# Patient Record
Sex: Female | Born: 1955 | Hispanic: No | Marital: Single | Smoking: Former smoker
Health system: Southern US, Community
[De-identification: ages and names within clinical notes are randomized; demographics above are authoritative.]

## PROBLEM LIST (undated history)

## (undated) DIAGNOSIS — R112 Nausea with vomiting, unspecified: Secondary | ICD-10-CM

## (undated) DIAGNOSIS — K219 Gastro-esophageal reflux disease without esophagitis: Secondary | ICD-10-CM

## (undated) DIAGNOSIS — M329 Systemic lupus erythematosus, unspecified: Secondary | ICD-10-CM

## (undated) DIAGNOSIS — Z9889 Other specified postprocedural states: Secondary | ICD-10-CM

## (undated) DIAGNOSIS — M3500 Sicca syndrome, unspecified: Secondary | ICD-10-CM

## (undated) DIAGNOSIS — M797 Fibromyalgia: Secondary | ICD-10-CM

## (undated) DIAGNOSIS — S62101A Fracture of unspecified carpal bone, right wrist, initial encounter for closed fracture: Secondary | ICD-10-CM

## (undated) HISTORY — PX: WRIST FRACTURE SURGERY: SHX121

## (undated) HISTORY — DX: Fibromyalgia: M79.7

## (undated) HISTORY — DX: Systemic lupus erythematosus, unspecified: M32.9

## (undated) HISTORY — PX: BACK SURGERY: SHX140

## (undated) HISTORY — PX: COLONOSCOPY: SHX174

---

## 2006-09-18 ENCOUNTER — Emergency Department: Payer: Self-pay | Admitting: Emergency Medicine

## 2009-04-28 ENCOUNTER — Emergency Department: Payer: Self-pay | Admitting: Unknown Physician Specialty

## 2010-11-08 ENCOUNTER — Emergency Department: Payer: Self-pay | Admitting: Unknown Physician Specialty

## 2011-09-01 DIAGNOSIS — M75101 Unspecified rotator cuff tear or rupture of right shoulder, not specified as traumatic: Secondary | ICD-10-CM

## 2011-09-01 HISTORY — DX: Unspecified rotator cuff tear or rupture of right shoulder, not specified as traumatic: M75.101

## 2012-12-15 ENCOUNTER — Ambulatory Visit: Payer: Self-pay | Admitting: Internal Medicine

## 2012-12-15 LAB — CBC WITH DIFFERENTIAL/PLATELET
Basophil #: 0.1 10*3/uL (ref 0.0–0.1)
Eosinophil #: 0.2 10*3/uL (ref 0.0–0.7)
Eosinophil %: 2.8 %
HGB: 11.9 g/dL — ABNORMAL LOW (ref 12.0–16.0)
Lymphocyte %: 27.8 %
MCHC: 31.9 g/dL — ABNORMAL LOW (ref 32.0–36.0)
Monocyte #: 0.6 x10 3/mm (ref 0.2–0.9)
Neutrophil #: 4.8 10*3/uL (ref 1.4–6.5)
Neutrophil %: 60.6 %
RDW: 13.2 % (ref 11.5–14.5)

## 2012-12-15 LAB — COMPREHENSIVE METABOLIC PANEL
Albumin: 3.9 g/dL (ref 3.4–5.0)
BUN: 10 mg/dL (ref 7–18)
Bilirubin,Total: 0.4 mg/dL (ref 0.2–1.0)
Calcium, Total: 9.1 mg/dL (ref 8.5–10.1)
EGFR (African American): >60
EGFR (Non-African Amer.): >60
Glucose: 95 mg/dL (ref 65–99)
Osmolality: 280 (ref 275–301)
SGOT(AST): 21 U/L (ref 15–37)
SGPT (ALT): 26 U/L (ref 12–78)
Total Protein: 7.7 g/dL (ref 6.4–8.2)

## 2012-12-19 ENCOUNTER — Encounter: Payer: Self-pay | Admitting: *Deleted

## 2012-12-20 ENCOUNTER — Ambulatory Visit (INDEPENDENT_AMBULATORY_CARE_PROVIDER_SITE_OTHER): Payer: BC Managed Care – PPO | Admitting: Cardiovascular Disease

## 2012-12-20 ENCOUNTER — Encounter: Payer: Self-pay | Admitting: Cardiovascular Disease

## 2012-12-20 VITALS — BP 102/68 | HR 89 | Ht 62.0 in | Wt 181.0 lb

## 2012-12-20 VITALS — BP 100/70 | HR 89 | Ht 62.0 in | Wt 181.0 lb

## 2012-12-20 DIAGNOSIS — R079 Chest pain, unspecified: Secondary | ICD-10-CM | POA: Insufficient documentation

## 2012-12-20 DIAGNOSIS — R0600 Dyspnea, unspecified: Secondary | ICD-10-CM

## 2012-12-20 DIAGNOSIS — R0609 Other forms of dyspnea: Secondary | ICD-10-CM

## 2012-12-20 NOTE — Assessment & Plan Note (Signed)
The patient has exertional chest pain which has been going on for a week after she was exposed to some form of a strange smell.  Physical examination is unremarkable and baseline ECG does not show any acute changes. She does have risk factors for coronary artery disease and thus I will proceed with a treadmill stress test for evaluation.

## 2012-12-20 NOTE — Patient Instructions (Addendum)
Your physician has requested that you have an exercise tolerance test. For further information please visit [email or web address]. Please also follow instruction sheet, as given.  Your physician has requested that you have an echocardiogram. Echocardiography is a painless test that uses sound waves to create images of your heart. It provides your doctor with information about the size and shape of your heart and how well your heart's chambers and valves are working. This procedure takes approximately one hour. There are no restrictions for this procedure.   

## 2012-12-20 NOTE — Assessment & Plan Note (Signed)
It is possible that she had some kind of pulmonary allergic reaction or bronchospasm. She is currently not wheezing. I will obtain an echocardiogram to evaluate LV systolic function and pulmonary pressure. If her cardiac evaluation is unremarkable and she continues to have symptoms, she will need pulmonary evaluation.

## 2012-12-20 NOTE — Progress Notes (Signed)
HPI  This is a 57 year old female who is here for consultation regarding chest pain and dyspnea. Sharon Weeks was referred by Dr. Dayton Scrape at [facility] urgent care. Sharon Weeks has no previous cardiac history. Sharon Weeks has known history of lupus, fibromyalgia and Sjgren syndrome. Sharon Weeks reports possibly having cardiac catheterization in the remote past and was told there was no significant blockages. Sharon Weeks has family history of coronary artery disease. Sharon Weeks is currently not a smoker. Sharon Weeks was in the airport last week and got in the airport shuttle. In size, there was a strange diesel-like smell which made her very sick with acute shortness of breath, chest tightness, coughing and vomiting. Since then, Sharon Weeks has not felt well. Sharon Weeks continues to report exertional chest tightness and shortness of breath. There has been no wheezing. Sharon Weeks was given breathing treatments at the urgent care with no significant improvement. EKG showed no significant abnormalities. Her labs were overall unremarkable except for mild anemia with a hemoglobin of 11.9. Sharon Weeks was not hypoxic.  Allergies  Allergen Reactions  . Codeine      Current Outpatient Prescriptions on File Prior to Visit  Medication Sig Dispense Refill  . aspirin 81 MG tablet Take 81 mg by mouth daily.      . hydroxychloroquine (PLAQUENIL) 200 MG tablet Take by mouth daily.      . nitroGLYCERIN (NITROSTAT) 0.4 MG SL tablet Place 0.4 mg under the tongue every 5 (five) minutes as needed for chest pain.       No current facility-administered medications on file prior to visit.     Past Medical History  Diagnosis Date  . Fibromyalgia   . Lupus      Past Surgical History  Procedure Laterality Date  . Back surgery       Family History  Problem Relation Age of Onset  . Heart attack Father   . Heart failure Sister      History   Social History  . Marital Status: Single    Spouse Name: N/A    Number of Children: N/A  . Years of Education: N/A   Occupational History  .  Not on file.   Social History Main Topics  . Smoking status: Former Smoker -- 0.50 packs/day for 12 years    Quit date: 12/20/1985  . Smokeless tobacco: Not on file  . Alcohol Use: Not on file  . Drug Use: Not on file  . Sexually Active: Not on file   Other Topics Concern  . Not on file   Social History Narrative  . No narrative on file     ROS Constitutional: Negative for fever, chills, diaphoresis, activity change, appetite change and fatigue.  HENT: Negative for hearing loss, nosebleeds, congestion, sore throat, facial swelling, drooling, trouble swallowing, neck pain, voice change, sinus pressure and tinnitus.  Eyes: Negative for photophobia, pain, discharge and visual disturbance.  Respiratory: Negative for   wheezing.  Cardiovascular: Negative for palpitations and leg swelling.  Gastrointestinal: Negative for nausea, vomiting, abdominal pain, diarrhea, constipation, blood in stool and abdominal distention.  Genitourinary: Negative for dysuria, urgency, frequency, hematuria and decreased urine volume.  Musculoskeletal: Negative for myalgias, back pain, joint swelling, arthralgias and gait problem.  Skin: Negative for color change, pallor, rash and wound.  Neurological: Negative for dizziness, tremors, seizures, syncope, speech difficulty, weakness, light-headedness, numbness and headaches.  Psychiatric/Behavioral: Negative for suicidal ideas, hallucinations, behavioral problems and agitation. The patient is not nervous/anxious.     PHYSICAL EXAM   BP 100/70  Pulse 89  Ht 5\' 2"  (1.575 m)  Wt 181 lb (82.101 kg)  BMI 33.1 kg/m2 Constitutional: Sharon Weeks is oriented to person, place, and time. Sharon Weeks appears well-developed and well-nourished. No distress.  HENT: No nasal discharge.  Head: Normocephalic and atraumatic.  Eyes: Pupils are equal and round. Right eye exhibits no discharge. Left eye exhibits no discharge.  Neck: Normal range of motion. Neck supple. No JVD present. No  thyromegaly present.  Cardiovascular: Normal rate, regular rhythm, normal heart sounds. Exam reveals no gallop and no friction rub. No murmur heard.  Pulmonary/Chest: Effort normal and breath sounds normal. No stridor. No respiratory distress. Sharon Weeks has no wheezes. Sharon Weeks has no rales. Sharon Weeks exhibits no tenderness.  Abdominal: Soft. Bowel sounds are normal. Sharon Weeks exhibits no distension. There is no tenderness. There is no rebound and no guarding.  Musculoskeletal: Normal range of motion. Sharon Weeks exhibits no edema and no tenderness.  Neurological: Sharon Weeks is alert and oriented to person, place, and time. Coordination normal.  Skin: Skin is warm and dry. No rash noted. Sharon Weeks is not diaphoretic. No erythema. No pallor.  Psychiatric: Sharon Weeks has a normal mood and affect. Her behavior is normal. Judgment and thought content normal.     EKG: Normal sinus rhythm with poor R-wave progression in the anterior leads. No significant ST or T wave changes.   ASSESSMENT AND PLAN

## 2012-12-20 NOTE — Procedures (Signed)
Exercise Treadmill Test  Pre-Exercise Testing Evaluation Rhythm: normal sinus  Rate: 92            ST Segments:  no significant ST changes at rest     Test  Exercise Tolerance Test Ordering MD: Lorine Bears, MD Interpreting MD: Odella Aquas, PA-C     Indication for ETT: chest pain - rule out ischemia  Contraindication to ETT: No   Stress Modality: exercise - treadmill  Cardiac Imaging Performed: non   Protocol: standard Bruce - maximal  Max BP:  164/62  Max MPHR (bpm):  166 85% MPR (bpm):  141  MPHR obtained (bpm):  169 % MPHR obtained:  102  Reached 85% MPHR (min:sec):  2:00 Total Exercise Time (min-sec):  9:00  Workload in METS:  10.1   Reason ETT Terminated:  FATIGUE    ST Segment Analysis At Rest: normal ST segments - no evidence of significant ST depression With Exercise: no evidence of significant ST depression  Other Information Arrhythmia:  No Angina during ETT:  absent (0)   ETT Interpretation:  normal - no evidence of ischemia by ST analysis  Comments:  Significant motion artifact. Poor R wave progression at rest, stress and recovery. Nonspecific diffuse upsloping ST depressions on exertion, returned to baseline on recovery. No chest pain with exercise.   Recommendations: Normal exercise tolerance test. No evidence of ischemia. Plan 2D echo for further evaluation and recommendations.

## 2012-12-20 NOTE — Patient Instructions (Addendum)
Your exercise stress test is normal.   Will plan to perform the ultrasound on your heart. Please arrive for this as scheduled.   If your symptoms continue in the next few weeks, please call the office for a referral to the lung specialists for further evaluation.

## 2013-01-03 ENCOUNTER — Other Ambulatory Visit (INDEPENDENT_AMBULATORY_CARE_PROVIDER_SITE_OTHER): Payer: BC Managed Care – PPO

## 2013-01-03 ENCOUNTER — Other Ambulatory Visit: Payer: Self-pay

## 2013-01-03 DIAGNOSIS — R0989 Other specified symptoms and signs involving the circulatory and respiratory systems: Secondary | ICD-10-CM

## 2013-01-03 DIAGNOSIS — R0609 Other forms of dyspnea: Secondary | ICD-10-CM

## 2013-01-03 DIAGNOSIS — R0600 Dyspnea, unspecified: Secondary | ICD-10-CM

## 2013-01-03 DIAGNOSIS — R079 Chest pain, unspecified: Secondary | ICD-10-CM

## 2013-11-04 ENCOUNTER — Ambulatory Visit: Payer: Self-pay | Admitting: Internal Medicine

## 2013-11-04 LAB — COMPREHENSIVE METABOLIC PANEL
ALBUMIN: 3.6 g/dL (ref 3.4–5.0)
ALT: 26 U/L (ref 12–78)
AST: 15 U/L (ref 15–37)
Alkaline Phosphatase: 78 U/L
Anion Gap: 10 (ref 7–16)
BILIRUBIN TOTAL: 0.2 mg/dL (ref 0.2–1.0)
BUN: 9 mg/dL (ref 7–18)
CALCIUM: 8.7 mg/dL (ref 8.5–10.1)
CO2: 26 mmol/L (ref 21–32)
CREATININE: 1.01 mg/dL (ref 0.60–1.30)
Chloride: 105 mmol/L (ref 98–107)
EGFR (Non-African Amer.): >60
GLUCOSE: 85 mg/dL (ref 65–99)
Osmolality: 279 (ref 275–301)
POTASSIUM: 3.9 mmol/L (ref 3.5–5.1)
SODIUM: 141 mmol/L (ref 136–145)
Total Protein: 7.2 g/dL (ref 6.4–8.2)

## 2013-11-04 LAB — URINALYSIS, COMPLETE
Bacteria: NEGATIVE
Bilirubin,UR: NEGATIVE
Blood: NEGATIVE
Glucose,UR: NEGATIVE mg/dL (ref 0–75)
Ketone: NEGATIVE
Leukocyte Esterase: NEGATIVE
Nitrite: NEGATIVE
Ph: 7.5 (ref 4.5–8.0)
Protein: NEGATIVE
Specific Gravity: 1.005 (ref 1.003–1.030)
WBC UR: NONE SEEN /HPF (ref 0–5)

## 2013-11-04 LAB — CBC WITH DIFFERENTIAL/PLATELET
Basophil #: 0 10*3/uL (ref 0.0–0.1)
Basophil %: 0.8 %
EOS PCT: 1.5 %
Eosinophil #: 0.1 10*3/uL (ref 0.0–0.7)
HCT: 38.6 % (ref 35.0–47.0)
HGB: 12.4 g/dL (ref 12.0–16.0)
LYMPHS PCT: 35.1 %
Lymphocyte #: 2 10*3/uL (ref 1.0–3.6)
MCH: 28.1 pg (ref 26.0–34.0)
MCHC: 32.2 g/dL (ref 32.0–36.0)
MCV: 87 fL (ref 80–100)
MONOS PCT: 7.8 %
Monocyte #: 0.4 x10 3/mm (ref 0.2–0.9)
NEUTROS ABS: 3.1 10*3/uL (ref 1.4–6.5)
Neutrophil %: 54.8 %
Platelet: 248 10*3/uL (ref 150–440)
RBC: 4.43 10*6/uL (ref 3.80–5.20)
RDW: 13 % (ref 11.5–14.5)
WBC: 5.6 10*3/uL (ref 3.6–11.0)

## 2013-11-04 LAB — CK: CK, TOTAL: 67 U/L

## 2013-11-04 LAB — CK-MB: CK-MB: 1.2 ng/mL (ref 0.5–3.6)

## 2014-06-27 DIAGNOSIS — M797 Fibromyalgia: Secondary | ICD-10-CM | POA: Insufficient documentation

## 2014-06-27 DIAGNOSIS — G4700 Insomnia, unspecified: Secondary | ICD-10-CM | POA: Insufficient documentation

## 2014-10-23 DIAGNOSIS — M329 Systemic lupus erythematosus, unspecified: Secondary | ICD-10-CM | POA: Insufficient documentation

## 2014-11-22 DIAGNOSIS — K219 Gastro-esophageal reflux disease without esophagitis: Secondary | ICD-10-CM | POA: Insufficient documentation

## 2015-01-31 DIAGNOSIS — M1712 Unilateral primary osteoarthritis, left knee: Secondary | ICD-10-CM | POA: Insufficient documentation

## 2015-06-22 ENCOUNTER — Encounter: Payer: Self-pay | Admitting: Gynecology

## 2015-06-22 ENCOUNTER — Ambulatory Visit
Admission: EM | Admit: 2015-06-22 | Discharge: 2015-06-22 | Disposition: A | Discharge status: Home / Self Care | Payer: Worker's Compensation | Attending: Emergency Medicine | Admitting: Emergency Medicine

## 2015-06-22 ENCOUNTER — Ambulatory Visit (INDEPENDENT_AMBULATORY_CARE_PROVIDER_SITE_OTHER): Payer: Worker's Compensation

## 2015-06-22 DIAGNOSIS — R52 Pain, unspecified: Secondary | ICD-10-CM | POA: Diagnosis not present

## 2015-06-22 DIAGNOSIS — S93402A Sprain of unspecified ligament of left ankle, initial encounter: Secondary | ICD-10-CM

## 2015-06-22 DIAGNOSIS — M25531 Pain in right wrist: Secondary | ICD-10-CM | POA: Diagnosis not present

## 2015-06-22 DIAGNOSIS — S8002XA Contusion of left knee, initial encounter: Secondary | ICD-10-CM

## 2015-06-22 MED ORDER — TRAMADOL HCL 50 MG PO TABS: 50.0000 mg | ORAL_TABLET | Freq: Three times a day (TID) | ORAL | Status: DC | PRN

## 2015-06-22 NOTE — Discharge Instructions (Signed)
Take medication as prescribed. Apply ice. Elevate and rest right rest. Wear right wrist splint as long as pain continues.  Follow-up with her primary care physician as needed. Discussed follow-up with orthopedic next week as needed for continued right wrist pain.  Return to the urgent care as needed for new or worsening concerns.  Ankle Sprain An ankle sprain is an injury to the strong, fibrous tissues (ligaments) that hold your ankle bones together.  HOME CARE   Put ice on your ankle for 1-2 days or as told by your doctor.  Put ice in a plastic bag.  Place a towel between your skin and the bag.  Leave the ice on for 15-20 minutes at a time, every 2 hours while you are awake.  Only take medicine as told by your doctor.  Raise (elevate) your injured ankle above the level of your heart as much as possible for 2-3 days.  Use crutches if your doctor tells you to. Slowly put your own weight on the affected ankle. Use the crutches until you can walk without pain.  If you have a plaster splint:  Do not rest it on anything harder than a pillow for 24 hours.  Do not put weight on it.  Do not get it wet.  Take it off to shower or bathe.  If given, use an elastic wrap or support stocking for support. Take the wrap off if your toes lose feeling (numb), tingle, or turn cold or blue.  If you have an air splint:  Add or let out air to make it comfortable.  Take it off at night and to shower and bathe.  Wiggle your toes and move your ankle up and down often while you are wearing it. GET HELP IF:  You have rapidly increasing bruising or puffiness (swelling).  Your toes feel very cold.  You lose feeling in your foot.  Your medicine does not help your pain. GET HELP RIGHT AWAY IF:   Your toes lose feeling (numb) or turn blue.  You have severe pain that is increasing. MAKE SURE YOU:   Understand these instructions.  Will watch your condition.  Will get help right away if  you are not doing well or get worse.   This information is not intended to replace advice given to you by your health care provider. Make sure you discuss any questions you have with your health care provider.   Document Released: 02/03/2008 Document Revised: 09/07/2014 Document Reviewed: 02/29/2012 Elsevier Interactive Patient Education 2016 [place] A contusion is a deep bruise. Contusions happen when an injury causes bleeding under the skin. Symptoms of bruising include pain, swelling, and discolored skin. The skin may turn blue, purple, or yellow. HOME CARE   Rest the injured area.  If told, put ice on the injured area.  Put ice in a plastic bag.  Place a towel between your skin and the bag.  Leave the ice on for 20 minutes, 2-3 times per day.  If told, put light pressure (compression) on the injured area using an elastic bandage. Make sure the bandage is not too tight. Remove it and put it back on as told by your doctor.  If possible, raise (elevate) the injured area above the level of your heart while you are sitting or lying down.  Take over-the-counter and prescription medicines only as told by your doctor. GET HELP IF:  Your symptoms do not get better after several days of treatment.  Your  symptoms get worse.  You have trouble moving the injured area. GET HELP RIGHT AWAY IF:   You have very bad pain.  You have a loss of feeling (numbness) in a hand or foot.  Your hand or foot turns pale or cold.   This information is not intended to replace advice given to you by your health care provider. Make sure you discuss any questions you have with your health care provider.   Document Released: 02/03/2008 Document Revised: 05/08/2015 Document Reviewed: 01/02/2015 Elsevier Interactive Patient Education 2016 [employer]  Wrist Pain There are many things that can cause wrist pain. Some common causes include:  An injury to the wrist area, such as a  sprain, strain, or fracture.  Overuse of the joint.  A condition that causes increased pressure on a nerve in the wrist (carpal tunnel syndrome).  Wear and tear of the joints that occurs with aging (osteoarthritis).  A variety of other types of arthritis. Sometimes, the cause of wrist pain is not known. The pain often goes away when you follow your health care provider's instructions for relieving pain at home. If your wrist pain continues, tests may need to be done to diagnose your condition. HOME CARE INSTRUCTIONS Pay attention to any changes in your symptoms. Take these actions to help with your pain:  Rest the wrist area for at least 48 hours or as told by your health care provider.  If directed, apply ice to the injured area:  Put ice in a plastic bag.  Place a towel between your skin and the bag.  Leave the ice on for 20 minutes, 2-3 times per day.  Keep your arm raised (elevated) above the level of your heart while you are sitting or lying down.  If a splint or elastic bandage has been applied, use it as told by your health care provider.  Remove the splint or bandage only as told by your health care provider.  Loosen the splint or bandage if your fingers become numb or have a tingling feeling, or if they turn cold or blue.  Take over-the-counter and prescription medicines only as told by your health care provider.  Keep all follow-up visits as told by your health care provider. This is important. SEEK MEDICAL CARE IF:  Your pain is not helped by treatment.  Your pain gets worse. SEEK IMMEDIATE MEDICAL CARE IF:  Your fingers become swollen.  Your fingers turn white, very red, or cold and blue.  Your fingers are numb or have a tingling feeling.  You have difficulty moving your fingers.   This information is not intended to replace advice given to you by your health care provider. Make sure you discuss any questions you have with your health care provider.     Document Released: 05/27/2005 Document Revised: 05/08/2015 Document Reviewed: 01/02/2015 Elsevier Interactive Patient Education [employer].

## 2015-06-22 NOTE — ED Notes (Signed)
Per patient step on mat at doorway at work when fell on right arm. Per patient right arm pain and left ankle twisted.

## 2015-06-22 NOTE — ED Provider Notes (Signed)
[facility] Emergency Department Provider Note  ____________________________________________  Time seen: Approximately 1230 PM  I have reviewed the triage vital signs and the nursing notes.   HISTORY  Chief Complaint Work Related Injury   HPI Sharon Weeks is a 59 y.o. female presents with a complaint of right wrist pain. Patient reports that just prior to arrival she was at work. Patient states that she was walking out the door way, she stepped on the mat but reports the door mat moved causing her to trip over the mat and fall. Patient states that she fell due to tripping over the mat directly. Patient states that she felt fine prior to the fall. States that when she landed she tried to catch herself with her right wrist. However reports that she was an able to get her palm out and hit the dorsal surface of her right wrist on the ground. Denies head injury or loss of consciousness. Denies neck or back injury or pain. Reports has been ambulatory since. Reports that the injury was 2 hours prior to arrival.  Patient reports current right wrist pain is 6-7 out of 10. States pain is an aching throbbing pain. Denies pain radiation. Denies numbness or tingling sensation. Denies difficulty moving finger or hands. Again states pain is primarily in right wrist.  Patient does also reports that when she landed she landed on both of her knees. Patient states that she also feels like she twisted her left ankle. States some left knee pain and left ankle pain. Denies right knee pain. Reports has continued to ambulate well but with mild pain to left knee and left ankle. Again denies neck or back pain. Denies head injury, loss of consciousness, neck or back pain or injury. Denies chest pain, shortness of breath, dizziness, nausea, vomiting, diarrhea, abdominal pain, vision changes or confusion. Daughter also at bedside.   Past Medical History  Diagnosis Date  . Fibromyalgia   .  Lupus [facility])     Patient Active Problem List   Diagnosis Date Noted  . Chest pain 12/20/2012  . Dyspnea 12/20/2012    Past Surgical History  Procedure Laterality Date  . Back surgery      Current Outpatient Rx  Name  Route  Sig  Dispense  Refill  . aspirin 81 MG tablet   Oral   Take 81 mg by mouth daily.         .             . gabapentin (NEURONTIN) 400 MG capsule               . hydroxychloroquine (PLAQUENIL) 200 MG tablet   Oral   Take by mouth daily.         . ranitidine (ZANTAC) 150 MG capsule   Oral   Take 150 mg by mouth 2 (two) times daily.         . nitroGLYCERIN (NITROSTAT) 0.4 MG SL tablet   Sublingual   Place 0.4 mg under the tongue every 5 (five) minutes as needed for chest pain.         .             Allergies Codeine  Family History  Problem Relation Age of Onset  . Heart attack Father   . Heart failure Sister     Social History Social History  Substance Use Topics  . Smoking status: Former Smoker -- 0.50 packs/day for 12 years    Quit  date: 12/20/1985  . Smokeless tobacco: None  . Alcohol Use: No    Review of Systems Constitutional: No fever/chills Eyes: No visual changes. ENT: No sore throat. Cardiovascular: Denies chest pain. Respiratory: Denies shortness of breath. Gastrointestinal: No abdominal pain.  No nausea, no vomiting.  No diarrhea.  No constipation. Genitourinary: Negative for dysuria. Musculoskeletal: Negative for back pain. Right wrist pain, left knee and ankle pain.  Skin: Negative for rash. Neurological: Negative for headaches, focal weakness or numbness.  10-point ROS otherwise negative.  ____________________________________________   PHYSICAL EXAM:  VITAL SIGNS: ED Triage Vitals  Enc Vitals Group     BP 06/22/15 1138 125/71 mmHg     Pulse Rate 06/22/15 1138 89     Resp 06/22/15 1138 16     Temp 06/22/15 1138 97.1 F (36.2 C)     Temp Source 06/22/15 1138 Tympanic     SpO2 06/22/15 1138 98  %     Weight 06/22/15 1138 169 lb (76.658 kg)     Height 06/22/15 1138 5\' 1"  (1.549 m)     Head Cir --      Peak Flow --      Pain Score 06/22/15 1136 8     Pain Loc --      Pain Edu? --      Excl. in [place]? --     Constitutional: Alert and oriented. Well appearing and in no acute distress. Eyes: Conjunctivae are normal. PERRL. EOMI. Head: Atraumatic.no swelling or ecchymosis. Nontender.   Nose: No congestion/rhinnorhea.  Mouth/Throat: Mucous membranes are moist.   Neck: No stridor.  No cervical spine tenderness to palpation. Hematological/Lymphatic/Immunilogical: No cervical lymphadenopathy. Cardiovascular: Normal rate, regular rhythm. Grossly normal heart sounds.  Good peripheral circulation. Respiratory: Normal respiratory effort.  No retractions. Lungs CTAB. Gastrointestinal: Soft and nontender. No distention. Normal Bowel sounds. No CVA tenderness. Musculoskeletal: No lower or upper extremity tenderness nor edema.  No joint effusions. Bilateral pedal pulses equal and easily palpated. No cervical, thoracic or lumbar tenderness to palpation.  Except: Right distal wrist mild to mod TTP along radial aspect, mild distal ulnar TTP, no swelling or ecchymosis, Full ROM, bilateral hand grips equal, distal radial pulses equal bilaterally, sensation and motor intact to right hand. No anatomical snuff box tenderness.  Left anterior knee mild TTP, minimal ecchymosis, no swelling, full ROM, stable, no pain with anterior or posterior drawer test, no pain with medial or lateral stress test.  Left lateral ankle mild TTP, no swelling or ecchymosis, full ROM, sensation and motor intact to left foot. Steady gait. Full ROM present to bilateral upper and lower extremities.  Neurologic:  Normal speech and language. No gross focal neurologic deficits are appreciated. No gait instability. Skin:  Skin is warm, dry and intact. No rash noted. Psychiatric: Mood and affect are normal. Speech and behavior are  normal.  ____________________________________________   LABS (all labs ordered are listed, but only abnormal results are displayed)  Labs Reviewed - No data to display  RADIOLOGY  EXAM: RIGHT WRIST - COMPLETE 3+ VIEW  COMPARISON: None  FINDINGS: Joint spaces preserved.  No acute fracture, dislocation or bone destruction.  Minimal dorsal soft tissue swelling.  IMPRESSION: No acute osseous abnormalities.   Electronically Signed By: Lavonia Dana M.D. On: 06/22/2015 13:13          DG Knee Complete 4 Views Left (Final result) Result time: 06/22/15 13:12:28   Final result by Rad Results In Interface (06/22/15 13:12:28)   Narrative:   CLINICAL  DATA: Tripped and fell on a rug at work injuring lateral side of LEFT knee, history lupus, fibromyalgia  EXAM: LEFT KNEE - COMPLETE 4+ VIEW  COMPARISON: None  FINDINGS: Medial compartment joint space narrowing and spur formation.  Mild osseous demineralization.  No acute fracture, dislocation or bone destruction.  No knee joint effusion.  Small spur at cranial margin of patellar articular surface.  IMPRESSION: Osseous demineralization with mild degenerative changes LEFT knee.  No acute bony abnormalities.   Electronically Signed By: Lavonia Dana M.D. On: 06/22/2015 13:12          DG Ankle Complete Left (Final result) Result time: 06/22/15 13:12:50   Final result by Rad Results In Interface (06/22/15 13:12:50)   Narrative:   CLINICAL DATA: Tripped and fell on rug at work injuring left lateral ankle.  EXAM: LEFT ANKLE COMPLETE - 3+ VIEW  COMPARISON: None.  FINDINGS: The ankle mortise is within normal. There is no acute fracture or dislocation. A small inferior calcaneal spur is present.  IMPRESSION: Negative.   Electronically Signed By: Marin Olp M.D. On: 06/22/2015 13:12      I, Marylene Land, personally viewed and evaluated these images (plain radiographs) as  part of my medical decision making.   ____________________________________________   PROCEDURES  Procedure(s) performed:  Right velcro cock up splint applied by RN. Neurovascular intact post application.   Patient denies need for crutches, knee or ankle splint.  ____________________________________________   INITIAL IMPRESSION / ASSESSMENT AND PLAN / ED COURSE  Pertinent labs & imaging results that were available during my care of the patient were reviewed by me and considered in my medical decision making (see chart for details).  Presents the urgent care post reports mechanical fall at work. Patient reports that she felt fine prior to the fall but reports that she tripped over a door mat which caused her to fall. States that she tried to catch herself with her right wrist however she was unable to. Also reports that she has pain to left knee and her left ankle. Denies head injury or loss of consciousness. Patient is very well-appearing in no acute distress. Changes position from lying to standing quickly without discomfort or distress. Ambulatory with steady gait. No focal neurological deficits. Will evaluate right wrist, left knee and left ankle x-ray. Patient does report that this is a workers [employer] loss [employer]. Suspect contusion injuries.   Xrays negative for acute changes. Right wrist splint for support. Ice and rest. PRN tramadol as needed for pain, patient reports has taken this in the past and tolerated well. PRN tylenol or ibuprofen over the counter. Discussed to wear wrist splint for as long as pain continues or one week. Discussed with patient to follow up with orthopedic in one week of employers choice as needed for continued pain. Discussed follow up with Primary care physician this week. Discussed follow up and return parameters including no resolution or any worsening concerns. Patient verbalized understanding and agreed to plan.    ____________________________________________   FINAL CLINICAL IMPRESSION(S) / ED DIAGNOSES  Final diagnoses:    Right wrist pain  Knee contusion, left, initial encounter  Left ankle sprain, initial encounter       Marylene Land, NP 06/23/15 [phone]

## 2015-07-08 DIAGNOSIS — S63519A Sprain of carpal joint of unspecified wrist, initial encounter: Secondary | ICD-10-CM | POA: Insufficient documentation

## 2017-07-05 ENCOUNTER — Ambulatory Visit
Admission: RE | Admit: 2017-07-05 | Discharge: 2017-07-05 | Disposition: A | Discharge status: Home / Self Care | Payer: Disability Insurance | Source: Ambulatory Visit | Attending: Otolaryngology | Admitting: Otolaryngology

## 2017-07-05 ENCOUNTER — Other Ambulatory Visit: Payer: Self-pay | Admitting: Otolaryngology

## 2017-07-05 ENCOUNTER — Ambulatory Visit
Admission: RE | Admit: 2017-07-05 | Discharge: 2017-07-05 | Disposition: A | Discharge status: Home / Self Care | Payer: Disability Insurance | Source: Ambulatory Visit | Attending: Diagnostic Radiology | Admitting: Diagnostic Radiology

## 2017-07-05 DIAGNOSIS — Z981 Arthrodesis status: Secondary | ICD-10-CM | POA: Insufficient documentation

## 2017-07-05 DIAGNOSIS — M5126 Other intervertebral disc displacement, lumbar region: Secondary | ICD-10-CM

## 2017-07-05 DIAGNOSIS — M4316 Spondylolisthesis, lumbar region: Secondary | ICD-10-CM | POA: Diagnosis not present

## 2017-07-05 DIAGNOSIS — M5136 Other intervertebral disc degeneration, lumbar region: Secondary | ICD-10-CM

## 2017-07-05 DIAGNOSIS — M1990 Unspecified osteoarthritis, unspecified site: Secondary | ICD-10-CM

## 2017-07-05 DIAGNOSIS — M797 Fibromyalgia: Secondary | ICD-10-CM | POA: Diagnosis present

## 2017-07-05 DIAGNOSIS — R9389 Abnormal findings on diagnostic imaging of other specified body structures: Secondary | ICD-10-CM | POA: Insufficient documentation

## 2017-07-05 DIAGNOSIS — M329 Systemic lupus erythematosus, unspecified: Secondary | ICD-10-CM | POA: Diagnosis present

## 2018-01-06 ENCOUNTER — Ambulatory Visit
Admission: EM | Admit: 2018-01-06 | Discharge: 2018-01-06 | Disposition: A | Discharge status: Home / Self Care | Payer: PRIVATE HEALTH INSURANCE | Attending: Family Medicine | Admitting: Family Medicine

## 2018-01-06 DIAGNOSIS — R05 Cough: Secondary | ICD-10-CM

## 2018-01-06 DIAGNOSIS — R0981 Nasal congestion: Secondary | ICD-10-CM

## 2018-01-06 DIAGNOSIS — R058 Other specified cough: Secondary | ICD-10-CM

## 2018-01-06 MED ORDER — BENZONATATE 100 MG PO CAPS: 100.0000 mg | ORAL_CAPSULE | Freq: Three times a day (TID) | ORAL | 0 refills | Status: DC | PRN

## 2018-01-06 MED ORDER — HYDROCOD POLST-CPM POLST ER 10-8 MG/5ML PO SUER: 5.0000 mL | Freq: Every evening | ORAL | 0 refills | Status: DC | PRN

## 2018-01-06 MED ORDER — DOXYCYCLINE HYCLATE 100 MG PO CAPS: 100.0000 mg | ORAL_CAPSULE | Freq: Two times a day (BID) | ORAL | 0 refills | Status: DC

## 2018-01-06 NOTE — ED Triage Notes (Signed)
Pt here for a cold for over a month and states it's getting worse. Runny nose, nasal congestion, productive cough and feeling tired/weak. Taking coricidin, cold and allergy medication and nyquil without relief.

## 2018-01-06 NOTE — Discharge Instructions (Addendum)
Take medication as prescribed. Rest. Drink plenty of fluids.  ° °Follow up with your primary care physician this week as needed. Return to Urgent care for new or worsening concerns.  ° °

## 2018-01-06 NOTE — ED Provider Notes (Signed)
[facility] ____________________________________________  Time seen: Approximately 6:37 PM  I have reviewed the triage vital signs and the nursing notes.   HISTORY  Chief Complaint URI   HPI Sharon Weeks is a 62 y.o. female presented for evaluation of cough and congestion symptoms have been present for the last 1 month.  States initially started off more like a cold or allergies, but has continued to progress.  States was more in nasal and facial, but now more in the chest.  States cough is intermittently productive.  States cough has been disrupting sleep and unrelieved with over-the-counter cough and congestion medications including NyQuil and Coricidin.  Denies known fevers, but has reports some intermittent chills and flushed sensations.  Denies known direct sick contacts.  Continues to overall eat and drink well, but decreased appetite.  No extremity swelling or extremity pain.  Denies history of COPD, asthma.  But does report history of allergic rhinitis.  Denies other aggravating or alleviating factors.  Reports otherwise feels well.Denies recent sickness. Denies recent antibiotic use.   Past Medical History:  Diagnosis Date  . Fibromyalgia   . Lupus [facility])     Patient Active Problem List   Diagnosis Date Noted  . Chest pain 12/20/2012  . Dyspnea 12/20/2012    Past Surgical History:  Procedure Laterality Date  . BACK SURGERY       No current facility-administered medications for this encounter.   Current Outpatient Medications:  .  gabapentin (NEURONTIN) 400 MG capsule, , Disp: , Rfl:  .  hydroxychloroquine (PLAQUENIL) 200 MG tablet, Take by mouth daily., Disp: , Rfl:  .  aspirin 81 MG tablet, Take 81 mg by mouth daily., Disp: , Rfl:  .  benzonatate (TESSALON PERLES) 100 MG capsule, Take 1 capsule (100 mg total) by mouth 3 (three) times daily as needed for cough., Disp: 15 capsule, Rfl: 0 .  chlorpheniramine-HYDROcodone (TUSSIONEX PENNKINETIC ER)  10-8 MG/5ML SUER, Take 5 mLs by mouth at bedtime as needed for cough. do not drive or operate machinery while taking as can cause drowsiness., Disp: 50 mL, Rfl: 0 .  cyclobenzaprine (FLEXERIL) 10 MG tablet, , Disp: , Rfl:  .  doxycycline (VIBRAMYCIN) 100 MG capsule, Take 1 capsule (100 mg total) by mouth 2 (two) times daily., Disp: 20 capsule, Rfl: 0 .  nitroGLYCERIN (NITROSTAT) 0.4 MG SL tablet, Place 0.4 mg under the tongue every 5 (five) minutes as needed for chest pain., Disp: , Rfl:  .  ranitidine (ZANTAC) 150 MG capsule, Take 150 mg by mouth 2 (two) times daily., Disp: , Rfl:  .  traMADol (ULTRAM) 50 MG tablet, Take 1 tablet (50 mg total) by mouth every 8 (eight) hours as needed (Do not drive or operate machinery while taking as can cause drowsiness.)., Disp: 12 tablet, Rfl: 0  Allergies Codeine  Family History  Problem Relation Age of Onset  . Heart attack Father   . Heart failure Sister     Social History Social History   Tobacco Use  . Smoking status: Former Smoker    Packs/day: 0.50    Years: 12.00    Pack years: 6.00    Last attempt to quit: 12/20/1985    Years since quitting: 32.0  . Smokeless tobacco: Never Used  Substance Use Topics  . Alcohol use: No  . Drug use: No    Review of Systems Constitutional: As above.  ENT: No sore throat. Cardiovascular: Denies chest pain. Respiratory: Denies shortness of breath. Gastrointestinal: No abdominal  pain.   Musculoskeletal: Negative for back pain. Skin: Negative for rash.  ____________________________________________   PHYSICAL EXAM:  VITAL SIGNS: ED Triage Vitals  Enc Vitals Group     BP 01/06/18 1745 (!) 119/101     Pulse Rate 01/06/18 1745 76     Resp 01/06/18 1745 18     Temp 01/06/18 1745 98.4 F (36.9 C)     Temp Source 01/06/18 1745 Oral     SpO2 01/06/18 1745 96 %     Weight 01/06/18 1747 169 lb (76.7 kg)     Height --      Head Circumference --      Peak Flow --      Pain Score 01/06/18 1747 0       Pain Loc --      Pain Edu? --      Excl. in [place]? --     Constitutional: Alert and oriented. Well appearing and in no acute distress. Eyes: Conjunctivae are normal. Head: Atraumatic. No sinus tenderness to palpation. No swelling. No erythema.  Ears: no erythema, normal TMs bilaterally.   Nose:Nasal congestion with clear rhinorrhea  Mouth/Throat: Mucous membranes are moist. No pharyngeal erythema. No tonsillar swelling or exudate.  Neck: No stridor.  No cervical spine tenderness to palpation. Hematological/Lymphatic/Immunilogical: No cervical lymphadenopathy. Cardiovascular: Normal rate, regular rhythm. Grossly normal heart sounds.  Good peripheral circulation. Respiratory: Normal respiratory effort.  No retractions.  Mild scattered rhonchi.  No wheezes.  No focal area of consolidation.  Dry intermittent cough in room.  Good air movement.  Musculoskeletal: Ambulatory with steady gait. No cervical, thoracic or lumbar tenderness to palpation.  Bilateral lower extremities nontender no edema noted. Neurologic:  Normal speech and language. No gait instability. Skin:  Skin appears warm, dry and intact. No rash noted. Psychiatric: Mood and affect are normal. Speech and behavior are normal.  ___________________________________________   LABS (all labs ordered are listed, but only abnormal results are displayed)  Labs Reviewed - No data to display  PROCEDURES Procedures    INITIAL IMPRESSION / ASSESSMENT AND PLAN / ED COURSE  Pertinent labs & imaging results that were available during my care of the patient were reviewed by me and considered in my medical decision making (see chart for details).  Well-appearing patient.  No acute distress.  Cough and congestion symptoms for 1 month.  Scattered rhonchi.  Suspect recent viral illness versus allergic rhinitis and concern for secondary infection.  Will treat patient with oral doxycycline, appearing Tessalon Perles and PRN Tussionex.  Patient  states GI distress with codeine, no issues with hydrocodone.  Encourage rest, fluids, supportive care.  Discussed follow-up and return parameters, including follow-up for chest imaging if symptoms not improving.Discussed indication, risks and benefits of medications with patient.  Discussed follow up with Primary care physician this week. Discussed follow up and return parameters including no resolution or any worsening concerns. Patient verbalized understanding and agreed to plan.   ____________________________________________   FINAL CLINICAL IMPRESSION(S) / ED DIAGNOSES  Final diagnoses:  Cough present for greater than 3 weeks  Nasal congestion     ED Discharge Orders        Ordered    doxycycline (VIBRAMYCIN) 100 MG capsule  2 times daily     01/06/18 1829    benzonatate (TESSALON PERLES) 100 MG capsule  3 times daily PRN     01/06/18 1829    chlorpheniramine-HYDROcodone (TUSSIONEX PENNKINETIC ER) 10-8 MG/5ML SUER  At bedtime PRN  01/06/18 1829       Note: This dictation was prepared with Dragon dictation along with smaller phrase technology. Any transcriptional errors that result from this process are unintentional.         Marylene Land, NP 01/06/18 1900

## 2018-04-18 DIAGNOSIS — Z13820 Encounter for screening for osteoporosis: Secondary | ICD-10-CM | POA: Insufficient documentation

## 2018-05-13 ENCOUNTER — Ambulatory Visit (INDEPENDENT_AMBULATORY_CARE_PROVIDER_SITE_OTHER): Payer: No Typology Code available for payment source

## 2018-05-13 ENCOUNTER — Other Ambulatory Visit: Payer: Self-pay

## 2018-05-13 ENCOUNTER — Ambulatory Visit
Admission: EM | Admit: 2018-05-13 | Discharge: 2018-05-13 | Disposition: A | Discharge status: Home / Self Care | Payer: No Typology Code available for payment source | Attending: Family Medicine | Admitting: Family Medicine

## 2018-05-13 ENCOUNTER — Encounter: Payer: Self-pay | Admitting: Emergency Medicine

## 2018-05-13 DIAGNOSIS — Z87891 Personal history of nicotine dependence: Secondary | ICD-10-CM | POA: Insufficient documentation

## 2018-05-13 DIAGNOSIS — M797 Fibromyalgia: Secondary | ICD-10-CM | POA: Insufficient documentation

## 2018-05-13 DIAGNOSIS — M329 Systemic lupus erythematosus, unspecified: Secondary | ICD-10-CM

## 2018-05-13 DIAGNOSIS — R0789 Other chest pain: Secondary | ICD-10-CM

## 2018-05-13 DIAGNOSIS — Z885 Allergy status to narcotic agent status: Secondary | ICD-10-CM | POA: Insufficient documentation

## 2018-05-13 DIAGNOSIS — R05 Cough: Secondary | ICD-10-CM | POA: Insufficient documentation

## 2018-05-13 DIAGNOSIS — R0602 Shortness of breath: Secondary | ICD-10-CM | POA: Diagnosis not present

## 2018-05-13 DIAGNOSIS — Z7982 Long term (current) use of aspirin: Secondary | ICD-10-CM | POA: Insufficient documentation

## 2018-05-13 DIAGNOSIS — Z79899 Other long term (current) drug therapy: Secondary | ICD-10-CM | POA: Insufficient documentation

## 2018-05-13 MED ORDER — PREDNISONE 10 MG (21) PO TBPK: ORAL_TABLET | ORAL | 0 refills | Status: DC

## 2018-05-13 NOTE — Discharge Instructions (Signed)
Chest xray normal.  Prednisone as prescribed.  Follow up with rheumatology.  Take care  Dr. Lacinda Axon

## 2018-05-13 NOTE — ED Provider Notes (Signed)
[facility]  CSN: [id number] Arrival date & time: 05/13/18  1423  History   Chief Complaint Chief Complaint  Patient presents with  . Shortness of Breath   HPI  62 year old female with fibromyalgia and lupus presents with shortness of breath.  She is also had intermittent chest pain.  Patient reports that she has had intermittent chest pain for months.  Left-sided.  Sharp.  She is had an ongoing cough.  Nonproductive.  Additionally, has had shortness of breath since Monday.  Worse with exertion.  Now occurs at rest.  No fever.  No chills.  No known relieving factors.  No other associated symptoms.  No other complaints.  PMH, Surgical Hx, Family Hx, Social History reviewed and updated as below.  Past Medical History:  Diagnosis Date  . Fibromyalgia   . Lupus [facility])    Patient Active Problem List   Diagnosis Date Noted  . Chest pain 12/20/2012  . Dyspnea 12/20/2012   Past Surgical History:  Procedure Laterality Date  . BACK SURGERY     OB History   None    Home Medications    Prior to Admission medications   Medication Sig Start Date End Date Taking? Authorizing Provider  aspirin 81 MG tablet Take 81 mg by mouth daily.   Yes [provider]  cetirizine (ZYRTEC) 10 MG tablet Take by mouth. 01/25/18  Yes [provider]  gabapentin (NEURONTIN) 400 MG capsule  12/10/12  Yes [provider]  hydroxychloroquine (PLAQUENIL) 200 MG tablet Take by mouth daily.   Yes [provider]  ranitidine (ZANTAC) 150 MG capsule Take 150 mg by mouth 2 (two) times daily.   Yes [provider]  nitroGLYCERIN (NITROSTAT) 0.4 MG SL tablet Place 0.4 mg under the tongue every 5 (five) minutes as needed for chest pain.    [provider]  predniSONE (STERAPRED UNI-PAK 21 TAB) 10 MG (21) TBPK tablet 6 tablets on day 1; decrease by 1 tablet daily until gone. 05/13/18   Coral Spikes, DO    Family History Family History  Problem  Relation Age of Onset  . Heart attack Father   . Heart failure Sister     Social History Social History   Tobacco Use  . Smoking status: Former Smoker    Packs/day: 0.50    Years: 12.00    Pack years: 6.00    Last attempt to quit: 12/20/1985    Years since quitting: 32.4  . Smokeless tobacco: Never Used  Substance Use Topics  . Alcohol use: No  . Drug use: No     Allergies   Codeine   Review of Systems Review of Systems  Constitutional: Negative for fever.  Respiratory: Positive for cough and shortness of breath.   Cardiovascular: Positive for chest pain.   Physical Exam Triage Vital Signs ED Triage Vitals  Enc Vitals Group     BP 05/13/18 1443 115/74     Pulse Rate 05/13/18 1443 68     Resp 05/13/18 1443 14     Temp 05/13/18 1443 97.8 F (36.6 C)     Temp Source 05/13/18 1443 Oral     SpO2 05/13/18 1443 100 %     Weight 05/13/18 1438 160 lb (72.6 kg)     Height 05/13/18 1438 5\' 3"  (1.6 m)     Head Circumference --      Peak Flow --      Pain Score 05/13/18 1438 5  Pain Loc --      Pain Edu? --      Excl. in [place]? --    Updated Vital Signs BP 115/74 (BP Location: Right Arm)   Pulse 68   Temp 97.8 F (36.6 C) (Oral)   Resp 14   Ht 5\' 3"  (1.6 m)   Wt 72.6 kg   SpO2 100%   BMI 28.34 kg/m   Visual Acuity Right Eye Distance:   Left Eye Distance:   Bilateral Distance:    Right Eye Near:   Left Eye Near:    Bilateral Near:     Physical Exam  Constitutional: She is oriented to person, place, and time. She appears well-developed. No distress.  HENT:  Head: Normocephalic and atraumatic.  Cardiovascular: Normal rate and regular rhythm.  Pulmonary/Chest: Effort normal and breath sounds normal. She has no wheezes. She has no rales. She exhibits tenderness.  Neurological: She is alert and oriented to person, place, and time.  Psychiatric: She has a normal mood and affect. Her behavior is normal.  Nursing note and vitals reviewed.  UC Treatments /  Results  Labs (all labs ordered are listed, but only abnormal results are displayed) Labs Reviewed - No data to display  EKG None  Radiology Dg Chest 2 View  Result Date: 05/13/2018 CLINICAL DATA:  Cough since Monday. Chest pain x a few months. Hx of lupus. EXAM: CHEST - 2 VIEW COMPARISON:  Chest x-ray dated 12/15/2012. FINDINGS: The heart size and mediastinal contours are within normal limits. Both lungs are clear. The visualized skeletal structures are unremarkable. IMPRESSION: No active cardiopulmonary disease. Electronically Signed   By: Franki Cabot M.D.   On: 05/13/2018 15:48    Procedures Procedures (including critical care time)  Medications Ordered in UC Medications - No data to display  Initial Impression / Assessment and Plan / UC Course  I have reviewed the triage vital signs and the nursing notes.  Pertinent labs & imaging results that were available during my care of the patient were reviewed by me and considered in my medical decision making (see chart for details).    62 year old female presents with shortness of breath.  Intermittent chest pain.  Patient with chest wall tenderness.  Likely pleuritic or muscular skeletal pain.  Lungs clear.  Chest x-ray negative.  I am not sure the etiology.  Possibly related to her lupus.  Prednisone taper.  Follow-up with rheumatology.  Final Clinical Impressions(s) / UC Diagnoses   Final diagnoses:  SOB (shortness of breath)     Discharge Instructions     Chest xray normal.  Prednisone as prescribed.  Follow up with rheumatology.  Take care  Dr. Lacinda Axon     ED Prescriptions    Medication Sig Dispense Auth. Provider   predniSONE (STERAPRED UNI-PAK 21 TAB) 10 MG (21) TBPK tablet 6 tablets on day 1; decrease by 1 tablet daily until gone. 21 tablet Coral Spikes, DO     Controlled Substance Prescriptions [place] Controlled Substance Registry consulted? Not Applicable   Coral Spikes, DO 05/13/18 1619

## 2018-05-13 NOTE — ED Triage Notes (Signed)
Patient c/o SOB that started on Monday.  Patient c/o chest pain off and on for months.

## 2018-07-03 DIAGNOSIS — R9439 Abnormal result of other cardiovascular function study: Secondary | ICD-10-CM | POA: Insufficient documentation

## 2018-07-25 HISTORY — PX: CARDIAC CATHETERIZATION: SHX172

## 2018-10-03 DIAGNOSIS — M545 Low back pain, unspecified: Secondary | ICD-10-CM | POA: Insufficient documentation

## 2019-08-29 ENCOUNTER — Ambulatory Visit (INDEPENDENT_AMBULATORY_CARE_PROVIDER_SITE_OTHER): Payer: PRIVATE HEALTH INSURANCE

## 2019-08-29 ENCOUNTER — Other Ambulatory Visit: Payer: Self-pay

## 2019-08-29 ENCOUNTER — Ambulatory Visit
Admission: EM | Admit: 2019-08-29 | Discharge: 2019-08-29 | Disposition: A | Discharge status: Home / Self Care | Payer: PRIVATE HEALTH INSURANCE | Attending: Family Medicine | Admitting: Family Medicine

## 2019-08-29 DIAGNOSIS — M25552 Pain in left hip: Secondary | ICD-10-CM

## 2019-08-29 MED ORDER — PREDNISONE 10 MG PO TABS: ORAL_TABLET | ORAL | 0 refills | Status: DC

## 2019-08-29 NOTE — Discharge Instructions (Signed)
Xray was normal.  Steroids as prescribed.  Follow up with PCP or rheumatology.  Take care  Dr. Lacinda Axon

## 2019-08-29 NOTE — ED Provider Notes (Signed)
[facility]    CSN: [id number] Arrival date & time: 08/29/19  0841      History   Chief Complaint Chief Complaint  Patient presents with  . Hip Pain    left   HPI  63 year old female presents with the above complaint.  Patient has known fibromyalgia as well as lupus and Sjogren's.  She sees rheumatology.  Rheumatologist documents that she has a history of chronic back pain with left-sided radiculopathy.  Patient reports a 1 month history of left-sided "hip pain".  Patient states that the pain radiates down her left leg.  Patient states that her pain is located in the groin as well as on the lateral hip.  No recent fall, trauma, injury.  Rates her pain as 9/10 in severity.  No relieving factors.  Seems to be getting worse.  Exacerbated by touch.  No relieving factors.  No other complaints.  PMH, Surgical Hx, Family Hx, Social History reviewed and updated as below.  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 Authorizing Provider  aspirin 81 MG tablet Take 81 mg by mouth daily.   Yes [provider]  cetirizine (ZYRTEC) 10 MG tablet Take by mouth. 01/25/18  Yes [provider]  gabapentin (NEURONTIN) 400 MG capsule  12/10/12  Yes [provider]  hydroxychloroquine (PLAQUENIL) 200 MG tablet Take by mouth daily.   Yes [provider]  predniSONE (DELTASONE) 10 MG tablet 50 mg daily x 2 days, then 40 mg daily x 2 days, then 30 mg daily x 2 days,  then 20 mg daily x 2 days, then 10 mg daily x 2 days. 08/29/19   Coral Spikes, DO  nitroGLYCERIN (NITROSTAT) 0.4 MG SL tablet Place 0.4 mg under the tongue every 5 (five) minutes as needed for chest pain.  08/29/19  [provider]  ranitidine (ZANTAC) 150 MG capsule Take 150 mg by mouth 2 (two) times daily.  08/29/19  [provider]    Family History Family History  Problem Relation Age of Onset  . Heart attack Father   . Heart failure Sister    Social History Social History   Tobacco Use  . Smoking status: Former Smoker    Packs/day: 0.50    Years: 12.00    Pack years: 6.00    Quit date: 12/20/1985    Years since quitting: 33.7  . Smokeless tobacco: Never Used  Substance Use Topics  . Alcohol use: No  . Drug use: No     Allergies   Codeine   Review of Systems Review of Systems  Constitutional: Negative.   Musculoskeletal:       Left hip pain.   Physical Exam Triage Vital Signs ED Triage Vitals  Enc Vitals Group     BP 08/29/19 0900 99/76     Pulse Rate 08/29/19 0900 80     Resp 08/29/19 0900 16     Temp 08/29/19 0900 98.2 F (36.8 C)  Temp Source 08/29/19 0900 Oral     SpO2 08/29/19 0900 98 %     Weight 08/29/19 0858 160 lb (72.6 kg)     Height 08/29/19 0858 5\' 2"  (1.575 m)     Head Circumference --      Peak Flow --      Pain Score 08/29/19 0858 9     Pain Loc --      Pain Edu? --      Excl. in [place]? --    Updated Vital Signs BP 99/76 (BP Location: Left Arm)   Pulse 80   Temp 98.2 F (36.8 C) (Oral)   Resp 16   Ht 5\' 2"  (1.575 m)   Wt 72.6 kg   SpO2 98%   BMI 29.26 kg/m   Visual Acuity Right Eye Distance:   Left Eye Distance:   Bilateral Distance:    Right Eye Near:   Left Eye Near:    Bilateral Near:     Physical Exam Vitals and nursing note reviewed.  Constitutional:      General: She is not in acute distress.    Appearance: Normal appearance. She is not ill-appearing.  HENT:     Head: Normocephalic and  atraumatic.  Eyes:     General:        Right eye: No discharge.        Left eye: No discharge.     Conjunctiva/sclera: Conjunctivae normal.  Cardiovascular:     Rate and Rhythm: Normal rate and regular rhythm.     Heart sounds: No murmur.  Pulmonary:     Effort: Pulmonary effort is normal.     Breath sounds: Normal breath sounds. No wheezing, rhonchi or rales.  Musculoskeletal:     Comments: Left hip -patient with tenderness over the greater trochanter and the IT band.  She has good range of motion of the hip.  Neurological:     Mental Status: She is alert.  Psychiatric:        Mood and Affect: Mood normal.        Behavior: Behavior normal.    UC Treatments / Results  Labs (all labs ordered are listed, but only abnormal results are displayed) Labs Reviewed - No data to display  EKG   Radiology DG Hip Unilat W or Wo Pelvis 2-3 Views Left  Result Date: 08/29/2019 CLINICAL DATA:  Left hip pain.  No known injury. EXAM: DG HIP (WITH OR WITHOUT PELVIS) 2-3V LEFT COMPARISON:  None. FINDINGS: Hip joints and SI joints are symmetric and unremarkable. No acute bony abnormality. Specifically, no fracture, subluxation, or dislocation. Postoperative changes from posterior fusion at the lumbosacral junction. IMPRESSION: No acute bony abnormality. Electronically Signed   By: Rolm Baptise M.D.   On: 08/29/2019 09:31    Procedures Procedures (including critical care time)  Medications Ordered in UC Medications - No data to display  Initial Impression / Assessment and Plan / UC Course  I have reviewed the triage vital signs and the nursing notes.  Pertinent labs & imaging results that were available during my care of the patient were reviewed by me and considered in my medical decision making (see chart for details).    63 year old female presents with left hip pain.  X-ray was negative.  She has tenderness over the greater trochanter.  I believe this is multifactorial.  She has chronic  low back pain as well as fibromyalgia.  Possible component of trochanteric bursitis.  Placing on steroids.  Advise  follow-up with PCP or rheumatology.  Final Clinical Impressions(s) / UC Diagnoses   Final diagnoses:  Left hip pain     Discharge Instructions     Xray was normal.  Steroids as prescribed.  Follow up with PCP or rheumatology.  Take care  Dr. Lacinda Axon     ED Prescriptions    Medication Sig Dispense Auth. Provider   predniSONE (DELTASONE) 10 MG tablet 50 mg daily x 2 days, then 40 mg daily x 2 days, then 30 mg daily x 2 days, then 20 mg daily x 2 days, then 10 mg daily x 2 days. 30 tablet Coral Spikes, DO     PDMP not reviewed this encounter.   Thersa Salt [place], [place] 08/29/19 [phone]

## 2019-08-29 NOTE — ED Triage Notes (Signed)
Patient states that she is having left hip pain that radiates down her leg. States that this has been ongoing x 1 month, without injury.

## 2019-11-11 ENCOUNTER — Ambulatory Visit: Payer: No Typology Code available for payment source | Attending: Internal Medicine

## 2019-11-11 DIAGNOSIS — Z23 Encounter for immunization: Secondary | ICD-10-CM

## 2019-11-11 NOTE — Progress Notes (Signed)
   Covid-19 Vaccination Clinic  Name:  Sharon Weeks    MRN: [id number] DOB: 15-Oct-1955  11/11/2019  [name] was observed post Covid-19 immunization for 15 minutes without incident. She was provided with Vaccine Information Sheet and instruction to access the V-Safe system.   [name] was instructed to call 911 with any severe reactions post vaccine: Marland Kitchen Difficulty breathing  . Swelling of face and throat  . A fast heartbeat  . A bad rash all over body  . Dizziness and weakness   Immunizations Administered    Name Date Dose VIS Date Route   Pfizer COVID-19 Vaccine 11/11/2019  9:25 AM 0.3 mL 08/11/2019 Intramuscular   Manufacturer: [place]   Lot: [id number]   [place]: [id number]

## 2019-12-06 ENCOUNTER — Ambulatory Visit: Payer: No Typology Code available for payment source | Attending: Internal Medicine

## 2019-12-06 DIAGNOSIS — Z23 Encounter for immunization: Secondary | ICD-10-CM

## 2019-12-06 NOTE — Progress Notes (Signed)
   Covid-19 Vaccination Clinic  Name:  Sharon Weeks    MRN: [id number] DOB: 17-Jul-1956  12/06/2019  Ms. Sharon Weeks was observed post Covid-19 immunization for 15 minutes without incident. She was provided with Vaccine Information Sheet and instruction to access the V-Safe system.   Ms. Sharon Weeks was instructed to call 911 with any severe reactions post vaccine: Marland Kitchen Difficulty breathing  . Swelling of face and throat  . A fast heartbeat  . A bad rash all over body  . Dizziness and weakness   Immunizations Administered    Name Date Dose VIS Date Route   Pfizer COVID-19 Vaccine 12/06/2019 10:33 AM 0.3 mL 08/11/2019 Intramuscular   Manufacturer: [employer]   Lot: [id number]   [place]: [id number]

## 2019-12-29 ENCOUNTER — Telehealth: Payer: No Typology Code available for payment source

## 2019-12-29 ENCOUNTER — Encounter: Payer: Self-pay | Admitting: *Deleted

## 2019-12-29 ENCOUNTER — Other Ambulatory Visit: Payer: Self-pay

## 2019-12-29 ENCOUNTER — Telehealth: Payer: Self-pay

## 2019-12-29 DIAGNOSIS — Q762 Congenital spondylolisthesis: Secondary | ICD-10-CM | POA: Insufficient documentation

## 2019-12-29 DIAGNOSIS — M48061 Spinal stenosis, lumbar region without neurogenic claudication: Secondary | ICD-10-CM | POA: Insufficient documentation

## 2019-12-29 DIAGNOSIS — M5126 Other intervertebral disc displacement, lumbar region: Secondary | ICD-10-CM | POA: Insufficient documentation

## 2019-12-29 DIAGNOSIS — M50321 Other cervical disc degeneration at C4-C5 level: Secondary | ICD-10-CM | POA: Insufficient documentation

## 2019-12-29 DIAGNOSIS — M064 Inflammatory polyarthropathy: Secondary | ICD-10-CM | POA: Insufficient documentation

## 2019-12-29 DIAGNOSIS — M25519 Pain in unspecified shoulder: Secondary | ICD-10-CM | POA: Insufficient documentation

## 2019-12-29 DIAGNOSIS — L989 Disorder of the skin and subcutaneous tissue, unspecified: Secondary | ICD-10-CM | POA: Insufficient documentation

## 2019-12-29 DIAGNOSIS — M3500 Sicca syndrome, unspecified: Secondary | ICD-10-CM | POA: Insufficient documentation

## 2019-12-29 DIAGNOSIS — M109 Gout, unspecified: Secondary | ICD-10-CM | POA: Insufficient documentation

## 2019-12-29 DIAGNOSIS — M9510 Cauliflower ear, unspecified ear: Secondary | ICD-10-CM | POA: Insufficient documentation

## 2019-12-29 NOTE — Telephone Encounter (Signed)
Contacted patient on home phone to schedule her colonoscopy.  There was a bad connection-static on the phone.  Could not hear anyone pick up only static.  LVM for her on her cell to call office back to reschedule.  Thanks,  [place], [place]

## 2020-01-13 DIAGNOSIS — H53149 Visual discomfort, unspecified: Secondary | ICD-10-CM | POA: Diagnosis not present

## 2020-01-13 DIAGNOSIS — D6862 Lupus anticoagulant syndrome: Secondary | ICD-10-CM | POA: Diagnosis not present

## 2020-01-13 DIAGNOSIS — M797 Fibromyalgia: Secondary | ICD-10-CM | POA: Diagnosis not present

## 2020-01-13 DIAGNOSIS — G43009 Migraine without aura, not intractable, without status migrainosus: Secondary | ICD-10-CM | POA: Diagnosis not present

## 2020-01-13 DIAGNOSIS — Z6829 Body mass index (BMI) 29.0-29.9, adult: Secondary | ICD-10-CM | POA: Diagnosis not present

## 2020-01-13 DIAGNOSIS — Z79899 Other long term (current) drug therapy: Secondary | ICD-10-CM | POA: Diagnosis not present

## 2020-01-13 DIAGNOSIS — Z7982 Long term (current) use of aspirin: Secondary | ICD-10-CM | POA: Diagnosis not present

## 2020-01-13 DIAGNOSIS — Z87891 Personal history of nicotine dependence: Secondary | ICD-10-CM | POA: Diagnosis not present

## 2020-01-13 DIAGNOSIS — G4700 Insomnia, unspecified: Secondary | ICD-10-CM | POA: Diagnosis not present

## 2020-04-01 DIAGNOSIS — R430 Anosmia: Secondary | ICD-10-CM | POA: Diagnosis not present

## 2020-04-01 DIAGNOSIS — R197 Diarrhea, unspecified: Secondary | ICD-10-CM | POA: Diagnosis not present

## 2020-04-01 DIAGNOSIS — Z03818 Encounter for observation for suspected exposure to other biological agents ruled out: Secondary | ICD-10-CM | POA: Diagnosis not present

## 2020-04-01 DIAGNOSIS — B349 Viral infection, unspecified: Secondary | ICD-10-CM | POA: Diagnosis not present

## 2020-04-01 DIAGNOSIS — R112 Nausea with vomiting, unspecified: Secondary | ICD-10-CM | POA: Diagnosis not present

## 2020-04-03 DIAGNOSIS — K529 Noninfective gastroenteritis and colitis, unspecified: Secondary | ICD-10-CM | POA: Diagnosis not present

## 2020-05-08 DIAGNOSIS — Z6831 Body mass index (BMI) 31.0-31.9, adult: Secondary | ICD-10-CM | POA: Diagnosis not present

## 2020-05-08 DIAGNOSIS — M7521 Bicipital tendinitis, right shoulder: Secondary | ICD-10-CM | POA: Diagnosis not present

## 2020-07-08 DIAGNOSIS — G8929 Other chronic pain: Secondary | ICD-10-CM | POA: Diagnosis not present

## 2020-07-08 DIAGNOSIS — M7541 Impingement syndrome of right shoulder: Secondary | ICD-10-CM | POA: Diagnosis not present

## 2020-07-08 DIAGNOSIS — M25511 Pain in right shoulder: Secondary | ICD-10-CM | POA: Diagnosis not present

## 2020-07-22 DIAGNOSIS — L500 Allergic urticaria: Secondary | ICD-10-CM | POA: Diagnosis not present

## 2020-07-22 DIAGNOSIS — Z6831 Body mass index (BMI) 31.0-31.9, adult: Secondary | ICD-10-CM | POA: Diagnosis not present

## 2020-09-30 DIAGNOSIS — M25511 Pain in right shoulder: Secondary | ICD-10-CM | POA: Diagnosis not present

## 2020-09-30 DIAGNOSIS — M6281 Muscle weakness (generalized): Secondary | ICD-10-CM | POA: Diagnosis not present

## 2020-09-30 DIAGNOSIS — M7541 Impingement syndrome of right shoulder: Secondary | ICD-10-CM | POA: Diagnosis not present

## 2020-10-07 DIAGNOSIS — M7541 Impingement syndrome of right shoulder: Secondary | ICD-10-CM | POA: Diagnosis not present

## 2020-10-07 DIAGNOSIS — M6281 Muscle weakness (generalized): Secondary | ICD-10-CM | POA: Diagnosis not present

## 2020-10-07 DIAGNOSIS — M25511 Pain in right shoulder: Secondary | ICD-10-CM | POA: Diagnosis not present

## 2020-10-09 DIAGNOSIS — M25511 Pain in right shoulder: Secondary | ICD-10-CM | POA: Diagnosis not present

## 2020-10-09 DIAGNOSIS — M7541 Impingement syndrome of right shoulder: Secondary | ICD-10-CM | POA: Diagnosis not present

## 2020-10-09 DIAGNOSIS — M6281 Muscle weakness (generalized): Secondary | ICD-10-CM | POA: Diagnosis not present

## 2020-10-22 DIAGNOSIS — M7541 Impingement syndrome of right shoulder: Secondary | ICD-10-CM | POA: Diagnosis not present

## 2020-11-07 ENCOUNTER — Other Ambulatory Visit: Payer: Self-pay | Admitting: Physician Assistant

## 2020-11-07 DIAGNOSIS — M12811 Other specific arthropathies, not elsewhere classified, right shoulder: Secondary | ICD-10-CM

## 2020-11-21 ENCOUNTER — Ambulatory Visit
Admission: RE | Admit: 2020-11-21 | Discharge: 2020-11-21 | Disposition: A | Discharge status: Home / Self Care | Payer: Medicare HMO | Source: Ambulatory Visit | Attending: Physician Assistant | Admitting: Physician Assistant

## 2020-11-21 ENCOUNTER — Other Ambulatory Visit: Payer: Self-pay

## 2020-11-21 DIAGNOSIS — M12811 Other specific arthropathies, not elsewhere classified, right shoulder: Secondary | ICD-10-CM | POA: Insufficient documentation

## 2021-02-07 ENCOUNTER — Other Ambulatory Visit: Payer: Self-pay | Admitting: Surgery

## 2021-02-12 ENCOUNTER — Other Ambulatory Visit
Admission: RE | Admit: 2021-02-12 | Discharge: 2021-02-12 | Disposition: A | Discharge status: Home / Self Care | Payer: Medicare PPO | Source: Ambulatory Visit | Attending: Surgery | Admitting: Surgery

## 2021-02-12 ENCOUNTER — Other Ambulatory Visit: Payer: Self-pay

## 2021-02-12 DIAGNOSIS — Z01818 Encounter for other preprocedural examination: Secondary | ICD-10-CM | POA: Insufficient documentation

## 2021-02-12 HISTORY — DX: Fracture of unspecified carpal bone, right wrist, initial encounter for closed fracture: S62.101A

## 2021-02-12 HISTORY — DX: Nausea with vomiting, unspecified: R11.2

## 2021-02-12 HISTORY — DX: Gastro-esophageal reflux disease without esophagitis: K21.9

## 2021-02-12 HISTORY — DX: Other specified postprocedural states: Z98.890

## 2021-02-12 HISTORY — DX: Sjogren syndrome, unspecified: M35.00

## 2021-02-12 LAB — CBC WITH DIFFERENTIAL/PLATELET
Abs Immature Granulocytes: 0.03 10*3/uL (ref 0.00–0.07)
Basophils Absolute: 0 10*3/uL (ref 0.0–0.1)
Basophils Relative: 1 %
Eosinophils Absolute: 0.3 10*3/uL (ref 0.0–0.5)
Eosinophils Relative: 4 %
HCT: 37.4 % (ref 36.0–46.0)
Hemoglobin: 11.8 g/dL — ABNORMAL LOW (ref 12.0–15.0)
Immature Granulocytes: 0 %
Lymphocytes Relative: 31 %
Lymphs Abs: 2.2 10*3/uL (ref 0.7–4.0)
MCH: 28.1 pg (ref 26.0–34.0)
MCHC: 31.6 g/dL (ref 30.0–36.0)
MCV: 89 fL (ref 80.0–100.0)
Monocytes Absolute: 0.6 10*3/uL (ref 0.1–1.0)
Monocytes Relative: 9 %
Neutro Abs: 3.9 10*3/uL (ref 1.7–7.7)
Neutrophils Relative %: 55 %
Platelets: 255 10*3/uL (ref 150–400)
RBC: 4.2 MIL/uL (ref 3.87–5.11)
RDW: 13.1 % (ref 11.5–15.5)
WBC: 7 10*3/uL (ref 4.0–10.5)
nRBC: 0 % (ref 0.0–0.2)

## 2021-02-12 LAB — COMPREHENSIVE METABOLIC PANEL
ALT: 20 U/L (ref 0–44)
AST: 23 U/L (ref 15–41)
Albumin: 3.9 g/dL (ref 3.5–5.0)
Alkaline Phosphatase: 54 U/L (ref 38–126)
Anion gap: 11 (ref 5–15)
BUN: 11 mg/dL (ref 8–23)
CO2: 26 mmol/L (ref 22–32)
Calcium: 9 mg/dL (ref 8.9–10.3)
Chloride: 104 mmol/L (ref 98–111)
Creatinine, Ser: 0.79 mg/dL (ref 0.44–1.00)
GFR, Estimated: >60 mL/min (ref >60)
Glucose, Bld: 88 mg/dL (ref 70–99)
Potassium: 4 mmol/L (ref 3.5–5.1)
Sodium: 141 mmol/L (ref 135–145)
Total Bilirubin: 0.5 mg/dL (ref 0.3–1.2)
Total Protein: 6.9 g/dL (ref 6.5–8.1)

## 2021-02-12 LAB — URINALYSIS, ROUTINE W REFLEX MICROSCOPIC
Bilirubin Urine: NEGATIVE
Glucose, UA: NEGATIVE mg/dL
Hgb urine dipstick: NEGATIVE
Ketones, ur: NEGATIVE mg/dL
Leukocytes,Ua: NEGATIVE
Nitrite: NEGATIVE
Protein, ur: NEGATIVE mg/dL
Specific Gravity, Urine: 1.016 (ref 1.005–1.030)
pH: 7 (ref 5.0–8.0)

## 2021-02-12 LAB — TYPE AND SCREEN
ABO/RH(D): A POS
Antibody Screen: NEGATIVE

## 2021-02-12 LAB — SURGICAL PCR SCREEN
MRSA, PCR: NEGATIVE
Staphylococcus aureus: NEGATIVE

## 2021-02-12 NOTE — Patient Instructions (Addendum)
Your procedure is scheduled on: Thursday, June 23 Report to the Registration Desk on the 1st floor of the [facility]. To find out your arrival time, please call [phone] between 1PM - 3PM on: Wednesday, June 22  REMEMBER: Instructions that are not followed completely may result in serious medical risk, up to and including death; or upon the discretion of your surgeon and anesthesiologist your surgery may need to be rescheduled.  Do not eat food after midnight the night before surgery.  No gum chewing, lozengers or hard candies.  You may however, drink CLEAR liquids up to 2 hours before you are scheduled to arrive for your surgery. Do not drink anything within 2 hours of your scheduled arrival time.  Clear liquids include: - water  - apple juice without pulp - gatorade (not RED, PURPLE, OR BLUE) - black coffee or tea (Do NOT add milk or creamers to the coffee or tea) Do NOT drink anything that is not on this list.  In addition, your doctor has ordered for you to drink the provided  Ensure Pre-Surgery Clear Carbohydrate Drink  Drinking this carbohydrate drink up to two hours before surgery helps to reduce insulin resistance and improve patient outcomes. Please complete drinking 2 hours prior to scheduled arrival time.  TAKE THESE MEDICATIONS THE MORNING OF SURGERY WITH A SIP OF WATER:   Gabapentin Hydroxychloroquine (Plaquenil)  One week prior to surgery: Stop aspirin and Anti-inflammatories (NSAIDS) such as Advil, Aleve, Ibuprofen, Motrin, Naproxen, Naprosyn and Aspirin based products such as Excedrin, Goodys Powder, BC Powder. Stop ANY OVER THE COUNTER supplements until after surgery. You may however, continue to take Tylenol if needed for pain up until the day of surgery.  No Alcohol for 24 hours before or after surgery.  No Smoking including e-cigarettes for 24 hours prior to surgery.  No chewable tobacco products for at least 6 hours prior to surgery.  No nicotine  patches on the day of surgery.  Do not use any "recreational" drugs for at least a week prior to your surgery.  Please be advised that the combination of cocaine and anesthesia may have negative outcomes, up to and including death. If you test positive for cocaine, your surgery will be cancelled.  On the morning of surgery brush your teeth with toothpaste and water, you may rinse your mouth with mouthwash if you wish. Do not swallow any toothpaste or mouthwash.  Do not wear jewelry, make-up, hairpins, clips or nail polish.  Do not wear lotions, powders, or perfumes.   Do not shave body from the neck down 48 hours prior to surgery just in case you cut yourself which could leave a site for infection.  Also, freshly shaved skin may become irritated if using the CHG soap.  Contact lenses, hearing aids and dentures may not be worn into surgery.  Do not bring valuables to the hospital. [facility] is not responsible for any missing/lost belongings or valuables.   Use CHG Soap as directed on instruction sheet.  Total Shoulder Arthroplasty:  use Benzolyl Peroxide 5% Gel as directed on instruction sheet.  Notify your doctor if there is any change in your medical condition (cold, fever, infection).  Wear comfortable clothing (specific to your surgery type) to the hospital.  After surgery, you can help prevent lung complications by doing breathing exercises.  Take deep breaths and cough every 1-2 hours. Your doctor may order a device called an Incentive Spirometer to help you take deep breaths.  If you  are being admitted to the hospital overnight, leave your suitcase in the car. After surgery it may be brought to your room.  If you are taking public transportation, you will need to have a responsible adult (18 years or older) with you. Please confirm with your physician that it is acceptable to use public transportation.   Please call the [place] [facility] at [phone] if  you have any questions about these instructions.  Surgery Visitation Policy:  Patients undergoing a surgery or procedure may have one family member or support person with them as long as that person is not COVID-19 positive or experiencing its symptoms.  That person may remain in the waiting area during the procedure.  Inpatient Visitation:    Visiting hours are 7 a.m. to 8 p.m. Inpatients will be allowed two visitors daily. The visitors may change each day during the patient's stay. No visitors under the age of 5. Any visitor under the age of 19 must be accompanied by an adult. The visitor must pass COVID-19 screenings, use hand sanitizer when entering and exiting the patient's room and wear a mask at all times, including in the patient's room. Patients must also wear a mask when staff or their visitor are in the room. Masking is required regardless of vaccination status.

## 2021-02-18 ENCOUNTER — Other Ambulatory Visit
Admission: RE | Admit: 2021-02-18 | Discharge: 2021-02-18 | Disposition: A | Discharge status: Home / Self Care | Payer: Medicare PPO | Source: Ambulatory Visit | Attending: Surgery | Admitting: Surgery

## 2021-02-18 ENCOUNTER — Other Ambulatory Visit: Payer: Self-pay

## 2021-02-18 DIAGNOSIS — Z20822 Contact with and (suspected) exposure to covid-19: Secondary | ICD-10-CM | POA: Diagnosis not present

## 2021-02-18 DIAGNOSIS — Z01812 Encounter for preprocedural laboratory examination: Secondary | ICD-10-CM | POA: Insufficient documentation

## 2021-02-18 LAB — SARS CORONAVIRUS 2 (TAT 6-24 HRS): SARS Coronavirus 2: NEGATIVE

## 2021-02-19 MED ORDER — FAMOTIDINE 20 MG PO TABS: 20.0000 mg | ORAL_TABLET | Freq: Once | ORAL | Status: AC

## 2021-02-19 MED ORDER — CHLORHEXIDINE GLUCONATE 0.12 % MT SOLN: 15.0000 mL | Freq: Once | OROMUCOSAL | Status: AC

## 2021-02-19 MED ORDER — APREPITANT 40 MG PO CAPS: 40.0000 mg | ORAL_CAPSULE | Freq: Once | ORAL | Status: AC

## 2021-02-19 MED ORDER — CEFAZOLIN SODIUM-DEXTROSE 2-4 GM/100ML-% IV SOLN
2.0000 g | INTRAVENOUS | Status: AC
  Administered 2021-02-20: 2 g via INTRAVENOUS

## 2021-02-19 MED ORDER — LACTATED RINGERS IV SOLN: INTRAVENOUS | Status: DC

## 2021-02-19 MED ORDER — ORAL CARE MOUTH RINSE: 15.0000 mL | Freq: Once | OROMUCOSAL | Status: AC

## 2021-02-20 ENCOUNTER — Inpatient Hospital Stay: Payer: Medicare PPO | Admitting: Certified Registered Nurse Anesthetist

## 2021-02-20 ENCOUNTER — Ambulatory Visit
Admission: RE | Admit: 2021-02-20 | Discharge: 2021-02-20 | Disposition: A | Discharge status: Home / Self Care | Payer: Medicare PPO | Attending: Surgery | Admitting: Surgery

## 2021-02-20 ENCOUNTER — Other Ambulatory Visit: Payer: Self-pay

## 2021-02-20 ENCOUNTER — Inpatient Hospital Stay: Payer: Medicare PPO

## 2021-02-20 ENCOUNTER — Encounter
Admission: RE | Disposition: A | Discharge status: Home / Self Care | Payer: Self-pay | Source: Home / Self Care | Attending: Surgery

## 2021-02-20 ENCOUNTER — Encounter: Payer: Self-pay | Admitting: Surgery

## 2021-02-20 DIAGNOSIS — M19011 Primary osteoarthritis, right shoulder: Secondary | ICD-10-CM | POA: Insufficient documentation

## 2021-02-20 DIAGNOSIS — S46011A Strain of muscle(s) and tendon(s) of the rotator cuff of right shoulder, initial encounter: Secondary | ICD-10-CM | POA: Insufficient documentation

## 2021-02-20 DIAGNOSIS — Z79899 Other long term (current) drug therapy: Secondary | ICD-10-CM | POA: Insufficient documentation

## 2021-02-20 DIAGNOSIS — Z7982 Long term (current) use of aspirin: Secondary | ICD-10-CM | POA: Insufficient documentation

## 2021-02-20 DIAGNOSIS — Z87891 Personal history of nicotine dependence: Secondary | ICD-10-CM | POA: Diagnosis not present

## 2021-02-20 DIAGNOSIS — Z419 Encounter for procedure for purposes other than remedying health state, unspecified: Secondary | ICD-10-CM

## 2021-02-20 DIAGNOSIS — X58XXXA Exposure to other specified factors, initial encounter: Secondary | ICD-10-CM | POA: Diagnosis not present

## 2021-02-20 DIAGNOSIS — Z96611 Presence of right artificial shoulder joint: Secondary | ICD-10-CM

## 2021-02-20 HISTORY — PX: REVERSE SHOULDER ARTHROPLASTY: SHX5054

## 2021-02-20 LAB — ABO/RH: ABO/RH(D): A POS

## 2021-02-20 SURGERY — ARTHROPLASTY, SHOULDER, TOTAL, REVERSE
Anesthesia: General | Site: Shoulder | Laterality: Right

## 2021-02-20 MED ORDER — CHLORHEXIDINE GLUCONATE 0.12 % MT SOLN
OROMUCOSAL | Status: AC
  Administered 2021-02-20: 15 mL via OROMUCOSAL
  Filled 2021-02-20: qty 15

## 2021-02-20 MED ORDER — FENTANYL CITRATE (PF) 100 MCG/2ML IJ SOLN
100.0000 ug | Freq: Once | INTRAMUSCULAR | Status: AC
  Administered 2021-02-20: 50 ug via INTRAVENOUS

## 2021-02-20 MED ORDER — CEFAZOLIN SODIUM-DEXTROSE 2-4 GM/100ML-% IV SOLN
2.0000 g | Freq: Four times a day (QID) | INTRAVENOUS | Status: DC
  Administered 2021-02-20: 2 g via INTRAVENOUS

## 2021-02-20 MED ORDER — ONDANSETRON HCL 4 MG/2ML IJ SOLN
4.0000 mg | Freq: Once | INTRAMUSCULAR | Status: AC | PRN
  Administered 2021-02-20: 4 mg via INTRAVENOUS

## 2021-02-20 MED ORDER — SODIUM CHLORIDE FLUSH 0.9 % IV SOLN
INTRAVENOUS | Status: AC
  Filled 2021-02-20: qty 10

## 2021-02-20 MED ORDER — ROCURONIUM BROMIDE 100 MG/10ML IV SOLN
INTRAVENOUS | Status: DC | PRN
  Administered 2021-02-20: 50 mg via INTRAVENOUS

## 2021-02-20 MED ORDER — CEFAZOLIN SODIUM-DEXTROSE 2-4 GM/100ML-% IV SOLN
INTRAVENOUS | Status: AC
  Filled 2021-02-20: qty 100

## 2021-02-20 MED ORDER — SODIUM CHLORIDE 0.9 % IR SOLN
Status: DC | PRN
  Administered 2021-02-20: 3000 mL

## 2021-02-20 MED ORDER — KETOROLAC TROMETHAMINE 15 MG/ML IJ SOLN
INTRAMUSCULAR | Status: AC
  Administered 2021-02-20: 15 mg via INTRAVENOUS
  Filled 2021-02-20: qty 1

## 2021-02-20 MED ORDER — FENTANYL CITRATE (PF) 100 MCG/2ML IJ SOLN
INTRAMUSCULAR | Status: AC
  Filled 2021-02-20: qty 2

## 2021-02-20 MED ORDER — METOCLOPRAMIDE HCL 5 MG/ML IJ SOLN: 5.0000 mg | Freq: Three times a day (TID) | INTRAMUSCULAR | Status: DC | PRN

## 2021-02-20 MED ORDER — ONDANSETRON 4 MG PO TBDP: 4.0000 mg | ORAL_TABLET | Freq: Four times a day (QID) | ORAL | 1 refills | Status: AC | PRN

## 2021-02-20 MED ORDER — APREPITANT 40 MG PO CAPS
ORAL_CAPSULE | ORAL | Status: AC
  Administered 2021-02-20: 40 mg via ORAL
  Filled 2021-02-20: qty 1

## 2021-02-20 MED ORDER — ACETAMINOPHEN 10 MG/ML IV SOLN
INTRAVENOUS | Status: AC
  Filled 2021-02-20: qty 100

## 2021-02-20 MED ORDER — BUPIVACAINE-EPINEPHRINE (PF) 0.5% -1:200000 IJ SOLN
INTRAMUSCULAR | Status: DC | PRN
  Administered 2021-02-20: 30 mL via PERINEURAL

## 2021-02-20 MED ORDER — FENTANYL CITRATE (PF) 100 MCG/2ML IJ SOLN
INTRAMUSCULAR | Status: DC | PRN
  Administered 2021-02-20: 50 ug via INTRAVENOUS

## 2021-02-20 MED ORDER — BUPIVACAINE-EPINEPHRINE (PF) 0.5% -1:200000 IJ SOLN
INTRAMUSCULAR | Status: AC
  Filled 2021-02-20: qty 30

## 2021-02-20 MED ORDER — KETOROLAC TROMETHAMINE 15 MG/ML IJ SOLN: 15.0000 mg | Freq: Once | INTRAMUSCULAR | Status: AC

## 2021-02-20 MED ORDER — PHENYLEPHRINE HCL (PRESSORS) 10 MG/ML IV SOLN
INTRAVENOUS | Status: DC | PRN
  Administered 2021-02-20 (×2): 100 ug via INTRAVENOUS

## 2021-02-20 MED ORDER — PROPOFOL 10 MG/ML IV BOLUS
INTRAVENOUS | Status: DC | PRN
  Administered 2021-02-20: 150 mg via INTRAVENOUS

## 2021-02-20 MED ORDER — KETOROLAC TROMETHAMINE 15 MG/ML IJ SOLN: 7.5000 mg | Freq: Four times a day (QID) | INTRAMUSCULAR | Status: DC

## 2021-02-20 MED ORDER — ACETAMINOPHEN 10 MG/ML IV SOLN
INTRAVENOUS | Status: DC | PRN
  Administered 2021-02-20: 1000 mg via INTRAVENOUS

## 2021-02-20 MED ORDER — SUGAMMADEX SODIUM 200 MG/2ML IV SOLN
INTRAVENOUS | Status: DC | PRN
  Administered 2021-02-20: 200 mg via INTRAVENOUS

## 2021-02-20 MED ORDER — BUPIVACAINE LIPOSOME 1.3 % IJ SUSP
INTRAMUSCULAR | Status: DC | PRN
  Administered 2021-02-20: 10 mL via PERINEURAL

## 2021-02-20 MED ORDER — DEXAMETHASONE SODIUM PHOSPHATE 10 MG/ML IJ SOLN
INTRAMUSCULAR | Status: DC | PRN
  Administered 2021-02-20: 10 mg via INTRAVENOUS

## 2021-02-20 MED ORDER — ACETAMINOPHEN 500 MG PO TABS: 500.0000 mg | ORAL_TABLET | Freq: Four times a day (QID) | ORAL | Status: DC

## 2021-02-20 MED ORDER — MIDAZOLAM HCL 2 MG/2ML IJ SOLN: 2.0000 mg | Freq: Once | INTRAMUSCULAR | Status: AC

## 2021-02-20 MED ORDER — ONDANSETRON HCL 4 MG PO TABS: 4.0000 mg | ORAL_TABLET | Freq: Four times a day (QID) | ORAL | Status: DC | PRN

## 2021-02-20 MED ORDER — BUPIVACAINE LIPOSOME 1.3 % IJ SUSP: INTRAMUSCULAR | Status: DC | PRN

## 2021-02-20 MED ORDER — TRANEXAMIC ACID 1000 MG/10ML IV SOLN
INTRAVENOUS | Status: DC | PRN
  Administered 2021-02-20: 1000 mg via INTRAVENOUS

## 2021-02-20 MED ORDER — FAMOTIDINE 20 MG PO TABS
ORAL_TABLET | ORAL | Status: AC
  Administered 2021-02-20: 20 mg via ORAL
  Filled 2021-02-20: qty 1

## 2021-02-20 MED ORDER — FENTANYL CITRATE (PF) 100 MCG/2ML IJ SOLN
INTRAMUSCULAR | Status: AC
  Administered 2021-02-20: 50 ug via INTRAVENOUS
  Filled 2021-02-20: qty 2

## 2021-02-20 MED ORDER — SODIUM CHLORIDE 0.9 % IV SOLN: INTRAVENOUS | Status: DC

## 2021-02-20 MED ORDER — MIDAZOLAM HCL 2 MG/2ML IJ SOLN
INTRAMUSCULAR | Status: AC
  Administered 2021-02-20: 2 mg via INTRAVENOUS
  Filled 2021-02-20: qty 2

## 2021-02-20 MED ORDER — METOCLOPRAMIDE HCL 10 MG PO TABS: 5.0000 mg | ORAL_TABLET | Freq: Three times a day (TID) | ORAL | Status: DC | PRN

## 2021-02-20 MED ORDER — HYDROCODONE-ACETAMINOPHEN 5-325 MG PO TABS: 1.0000 | ORAL_TABLET | Freq: Four times a day (QID) | ORAL | 0 refills | Status: AC | PRN

## 2021-02-20 MED ORDER — TRANEXAMIC ACID 1000 MG/10ML IV SOLN
INTRAVENOUS | Status: AC
  Filled 2021-02-20: qty 10

## 2021-02-20 MED ORDER — ONDANSETRON HCL 4 MG/2ML IJ SOLN: 4.0000 mg | Freq: Four times a day (QID) | INTRAMUSCULAR | Status: DC | PRN

## 2021-02-20 MED ORDER — BUPIVACAINE LIPOSOME 1.3 % IJ SUSP
INTRAMUSCULAR | Status: AC
  Filled 2021-02-20: qty 20

## 2021-02-20 MED ORDER — LIDOCAINE HCL (CARDIAC) PF 100 MG/5ML IV SOSY
PREFILLED_SYRINGE | INTRAVENOUS | Status: DC | PRN
  Administered 2021-02-20: 100 mg via INTRAVENOUS

## 2021-02-20 MED ORDER — BUPIVACAINE HCL (PF) 0.5 % IJ SOLN
INTRAMUSCULAR | Status: AC
  Filled 2021-02-20: qty 10

## 2021-02-20 MED ORDER — ONDANSETRON HCL 4 MG/2ML IJ SOLN
INTRAMUSCULAR | Status: AC
  Filled 2021-02-20: qty 2

## 2021-02-20 MED ORDER — BUPIVACAINE HCL (PF) 0.5 % IJ SOLN
INTRAMUSCULAR | Status: DC | PRN
  Administered 2021-02-20: 10 mL via PERINEURAL

## 2021-02-20 MED ORDER — SODIUM CHLORIDE 0.9 % IV SOLN
INTRAVENOUS | Status: DC | PRN
  Administered 2021-02-20: 50 ug/min via INTRAVENOUS

## 2021-02-20 MED ORDER — ONDANSETRON HCL 4 MG/2ML IJ SOLN
INTRAMUSCULAR | Status: DC | PRN
  Administered 2021-02-20: 4 mg via INTRAVENOUS

## 2021-02-20 MED ORDER — FENTANYL CITRATE (PF) 100 MCG/2ML IJ SOLN: 25.0000 ug | INTRAMUSCULAR | Status: DC | PRN

## 2021-02-20 MED ORDER — MEPERIDINE HCL 25 MG/ML IJ SOLN: 6.2500 mg | INTRAMUSCULAR | Status: DC | PRN

## 2021-02-20 MED ORDER — HYDROCODONE-ACETAMINOPHEN 5-325 MG PO TABS
1.0000 | ORAL_TABLET | ORAL | Status: DC | PRN
Start: 2021-02-20 — End: 2021-02-20

## 2021-02-20 SURGICAL SUPPLY — 74 items
APL PRP STRL LF DISP 70% ISPRP (MISCELLANEOUS) ×1
BASEPLATE BOSS DRILL (MISCELLANEOUS) ×2 IMPLANT
BIT DRILL 2.5 (BIT) ×2
BIT DRILL 2.5X4.5XSCR (BIT) ×1 IMPLANT
BIT DRILL BASEPLATE CENTRAL S (BIT) ×2 IMPLANT
BIT DRL 2.5X4.5XSCR (BIT) ×1
BLADE SAW SAG 25X90X1.19 (BLADE) ×2 IMPLANT
BNDG COHESIVE 4X5 TAN STRL (GAUZE/BANDAGES/DRESSINGS) ×2 IMPLANT
BSPLAT GLND THK2 22.8X27.3 (Plate) ×1 IMPLANT
CHLORAPREP W/TINT 26 (MISCELLANEOUS) ×2 IMPLANT
COOLER POLAR GLACIER W/PUMP (MISCELLANEOUS) ×2 IMPLANT
COVER BACK TABLE REUSABLE LG (DRAPES) ×2 IMPLANT
COVER WAND RF STERILE (DRAPES) ×2 IMPLANT
DRAPE 3/4 80X56 (DRAPES) ×4 IMPLANT
DRAPE IMP U-DRAPE 54X76 (DRAPES) ×4 IMPLANT
DRAPE INCISE IOBAN 66X45 STRL (DRAPES) ×4 IMPLANT
DRSG OPSITE POSTOP 4X8 (GAUZE/BANDAGES/DRESSINGS) ×2 IMPLANT
ELECT BLADE 6.5 EXT (BLADE) IMPLANT
ELECT CAUTERY BLADE 6.4 (BLADE) ×2 IMPLANT
ELECT REM PT RETURN 9FT ADLT (ELECTROSURGICAL) ×2
ELECTRODE REM PT RTRN 9FT ADLT (ELECTROSURGICAL) ×1 IMPLANT
GAUZE XEROFORM 1X8 LF (GAUZE/BANDAGES/DRESSINGS) ×2 IMPLANT
GLENOSPHERE RSS CONCENTRIC-S 5 (Shoulder) ×2 IMPLANT
GLOVE SRG 8 PF TXTR STRL LF DI (GLOVE) ×2 IMPLANT
GLOVE SURG ENC MOIS LTX SZ7.5 (GLOVE) ×8 IMPLANT
GLOVE SURG ENC MOIS LTX SZ8 (GLOVE) ×8 IMPLANT
GLOVE SURG UNDER LTX SZ8 (GLOVE) ×2 IMPLANT
GLOVE SURG UNDER POLY LF SZ8 (GLOVE) ×4
GOWN STRL REUS W/ TWL LRG LVL3 (GOWN DISPOSABLE) ×1 IMPLANT
GOWN STRL REUS W/ TWL XL LVL3 (GOWN DISPOSABLE) ×1 IMPLANT
GOWN STRL REUS W/TWL LRG LVL3 (GOWN DISPOSABLE) ×2
GOWN STRL REUS W/TWL XL LVL3 (GOWN DISPOSABLE) ×2
GUIDE PIN 2.0 X 150 (WIRE) ×2 IMPLANT
HOOD PEEL AWAY FLYTE STAYCOOL (MISCELLANEOUS) ×8 IMPLANT
ILLUMINATOR WAVEGUIDE N/F (MISCELLANEOUS) IMPLANT
IV NS IRRIG 3000ML ARTHROMATIC (IV SOLUTION) ×2 IMPLANT
KIT STABILIZATION SHOULDER (MISCELLANEOUS) ×2 IMPLANT
KIT TURNOVER KIT A (KITS) ×2 IMPLANT
LINER STD +3S RSS HXL (Liner) ×2 IMPLANT
MANIFOLD NEPTUNE II (INSTRUMENTS) ×2 IMPLANT
MASK FACE SPIDER DISP (MASK) ×2 IMPLANT
MAT ABSORB  FLUID 56X50 GRAY (MISCELLANEOUS) ×1
MAT ABSORB FLUID 56X50 GRAY (MISCELLANEOUS) ×1 IMPLANT
NDL SAFETY ECLIPSE 18X1.5 (NEEDLE) ×1 IMPLANT
NEEDLE HYPO 18GX1.5 SHARP (NEEDLE) ×2
NEEDLE HYPO 22GX1.5 SAFETY (NEEDLE) ×2 IMPLANT
NEEDLE SPNL 20GX3.5 QUINCKE YW (NEEDLE) ×2 IMPLANT
NS IRRIG 500ML POUR BTL (IV SOLUTION) ×2 IMPLANT
PACK ARTHROSCOPY SHOULDER (MISCELLANEOUS) ×2 IMPLANT
PAD ARMBOARD 7.5X6 YLW CONV (MISCELLANEOUS) ×2 IMPLANT
PAD WRAPON POLAR SHDR UNIV (MISCELLANEOUS) ×1 IMPLANT
PENCIL SMOKE EVACUATOR (MISCELLANEOUS) ×2 IMPLANT
PLATE BASE REVERSE RSS S (Plate) ×2 IMPLANT
PULSAVAC PLUS IRRIG FAN TIP (DISPOSABLE) ×2
SCREW 4.5X15 RSS W CAP (Screw) ×2 IMPLANT
SCREW 4.5X20 RSS W CAP (Screw) ×4 IMPLANT
SCREW 4.5X25 RSS W CAP (Screw) ×2 IMPLANT
SCREW 4.5X35 RSS W CAP (Screw) ×2 IMPLANT
SCREW BODY REVERSE STD (Screw) ×2 IMPLANT
SLING ULTRA II M (MISCELLANEOUS) ×2 IMPLANT
SPONGE LAP 18X18 RF (DISPOSABLE) ×2 IMPLANT
STAPLER SKIN PROX 35W (STAPLE) ×2 IMPLANT
STEM PRES FIT SZ 12 TSS (Stem) ×2 IMPLANT
SUT ETHIBOND 0 MO6 C/R (SUTURE) ×2 IMPLANT
SUT FIBERWIRE #2 38 BLUE 1/2 (SUTURE) ×8
SUT VIC AB 0 CT1 36 (SUTURE) ×2 IMPLANT
SUT VIC AB 2-0 CT1 27 (SUTURE) ×4
SUT VIC AB 2-0 CT1 TAPERPNT 27 (SUTURE) ×2 IMPLANT
SUTURE FIBERWR #2 38 BLUE 1/2 (SUTURE) ×4 IMPLANT
SYR 10ML LL (SYRINGE) ×2 IMPLANT
SYR 30ML LL (SYRINGE) ×2 IMPLANT
TIP FAN IRRIG PULSAVAC PLUS (DISPOSABLE) ×1 IMPLANT
TOWEL OR 17X26 4PK STRL BLUE (TOWEL DISPOSABLE) ×2 IMPLANT
WRAPON POLAR PAD SHDR UNIV (MISCELLANEOUS) ×2

## 2021-02-20 NOTE — Discharge Instructions (Addendum)
Orthopedic discharge instructions: May shower with intact OpSite dressing once the nerve block has worn off. Apply ice frequently to shoulder or use Polar Care device. Take ibuprofen 600-800 mg TID with meals for 7-10 days, then as necessary. Take hydrocodone as prescribed when needed.  May supplement with ES Tylenol if necessary. Keep shoulder immobilizer on at all times except may remove for bathing purposes. Follow-up in 10-14 days or as scheduled.  AMBULATORY SURGERY  DISCHARGE INSTRUCTIONS   The drugs that you were given will stay in your system until tomorrow so for the next 24 hours you should not:  Drive an automobile Make any legal decisions Drink any alcoholic beverage   You may resume regular meals tomorrow.  Today it is better to start with liquids and gradually work up to solid foods.  You may eat anything you prefer, but it is better to start with liquids, then soup and crackers, and gradually work up to solid foods.   Please notify your doctor immediately if you have any unusual bleeding, trouble breathing, redness and pain at the surgery site, drainage, fever, or pain not relieved by medication.    Additional Instructions:        Please contact your physician with any problems or Same Day Surgery at [phone], Monday through Friday 6 am to 4 pm, or [place] at [facility] number at [phone].

## 2021-02-20 NOTE — Anesthesia Procedure Notes (Addendum)
Anesthesia Regional Block: Interscalene brachial plexus block   Pre-Anesthetic Checklist: , timeout performed,  Correct Patient, Correct Site, Correct Laterality,  Correct Procedure, Correct Position, site marked,  Risks and benefits discussed,  Surgical consent,  Pre-op evaluation,  At surgeon's request and post-op pain management  Laterality: Right  Prep: chloraprep       Needles:  Injection technique: Single-shot  Needle Type: Stimiplex     Needle Length: 9cm  Needle Gauge: 22   Needle insertion depth: 3 cm   Additional Needles:   Procedures:, nerve stimulator,,, ultrasound used (permanent image in chart),,     Nerve Stimulator or Paresthesia:  Response: R Forearm  Additional Responses:   Narrative:  Start time: 02/20/2021 10:00 AM End time: 02/20/2021 10:02 AM Injection made incrementally with aspirations every 5 mL.  Performed by: Personally  Anesthesiologist: Phill Mutter, MD  Additional Notes: Patient consented for risk and benefits of nerve block including but not limited to nerve damage, failed block, bleeding and infection.  Patient voiced understanding.  Functioning IV was confirmed and monitors were applied.  Timeout done prior to procedure and prior to any sedation being given to the patient.  Patient confirmed procedure site prior to any sedation given to the patient.  A 69mm 22ga Stimuplex needle was used. Sterile prep,hand hygiene and sterile gloves were used.  Minimal sedation used for procedure.  No paresthesia endorsed by patient during the procedure.  Negative aspiration and negative test dose prior to incremental administration of local anesthetic. The patient tolerated the procedure well with no immediate complications.  IV Conscious Sedation with versed 2mg  + Fent 178mcg @ 10 AM  Injectate:  10 ml 0.5% bupivacaine diluted to 0.25% with NS plus 10 ml Exparel

## 2021-02-20 NOTE — Anesthesia Procedure Notes (Deleted)
Anesthesia Procedure Note     

## 2021-02-20 NOTE — Anesthesia Postprocedure Evaluation (Signed)
Anesthesia Post Note  Patient: Sharon Weeks  Procedure(s) Performed: REVERSE SHOULDER ARTHROPLASTY (Right: Shoulder)  Patient location during evaluation: PACU Anesthesia Type: General Level of consciousness: awake and alert, awake and oriented Pain management: pain level controlled Vital Signs Assessment: post-procedure vital signs reviewed and stable Respiratory status: spontaneous breathing, nonlabored ventilation and respiratory function stable Cardiovascular status: blood pressure returned to baseline and stable Postop Assessment: no apparent nausea or vomiting Anesthetic complications: no   No notable events documented.   Last Vitals:  Vitals:   02/20/21 1311 02/20/21 1315  BP:  (!) 111/49  Pulse: 95 94  Resp: 14 14  Temp:    SpO2: 93% (!) 89%    Last Pain:  Vitals:   02/20/21 1307  TempSrc:   PainSc: 0-No pain                 Phill Mutter

## 2021-02-20 NOTE — Transfer of Care (Signed)
Immediate Anesthesia Transfer of Care Note  Patient: Sharon Weeks  Procedure(s) Performed: REVERSE SHOULDER ARTHROPLASTY (Right: Shoulder)  Patient Location: PACU  Anesthesia Type:General  Level of Consciousness: awake, alert  and oriented  Airway & Oxygen Therapy: Patient Spontanous Breathing  Post-op Assessment: Report given to RN and Post -op Vital signs reviewed and stable  Post vital signs: Reviewed and stable  Last Vitals:  Vitals Value Taken Time  BP 130/51 02/20/21 1307  Temp    Pulse 95 02/20/21 1311  Resp 14 02/20/21 1311  SpO2 93 % 02/20/21 1311  Vitals shown include unvalidated device data.  Last Pain:  Vitals:   02/20/21 0854  TempSrc: Temporal  PainSc: 5          Complications: No notable events documented.

## 2021-02-20 NOTE — Progress Notes (Signed)
Pt ambulating without difficulty to bathroom.  Resting quietly in post op room with daughter.  Denies pain.  VSS, NAD.  Will continue to monitor.

## 2021-02-20 NOTE — Anesthesia Procedure Notes (Addendum)
Procedure Name: Intubation Date/Time: 02/20/2021 10:40 AM Performed by: Louann Sjogren, CRNA Pre-anesthesia Checklist: Patient identified, Patient being monitored, Timeout performed, Emergency Drugs available and Suction available Patient Re-evaluated:Patient Re-evaluated prior to induction Oxygen Delivery Method: Circle system utilized Preoxygenation: Pre-oxygenation with 100% oxygen Induction Type: IV induction Ventilation: Mask ventilation without difficulty Laryngoscope Size: McGraph and 4 Grade View: Grade I Tube type: Oral Tube size: 6.5 mm Number of attempts: 1 Airway Equipment and Method: Stylet Placement Confirmation: ETT inserted through vocal cords under direct vision, positive ETCO2 and breath sounds checked- equal and bilateral Secured at: 21 cm Tube secured with: Tape Dental Injury: Teeth and Oropharynx as per pre-operative assessment

## 2021-02-20 NOTE — Op Note (Signed)
02/20/2021  1:15 PM  Patient:   Sharon Weeks  Pre-Op Diagnosis:   Massive irreparable rotator cuff tear with early cuff arthropathy, right shoulder.  Post-Op Diagnosis:   Same.  Procedure:   Reverse right total shoulder arthroplasty.  Surgeon:   Pascal Lux, MD  Assistant:   Waylan Boga, RNFA  Anesthesia:   General endotracheal with an interscalene block using Exparel placed preoperatively by the anesthesiologist.  Findings:   As above.  Complications:   None  EBL:   150 cc  Fluids:   800 cc crystalloid  UOP:   None  TT:   None  Drains:   None  Closure:   Staples  Implants:   All press-fit Integra system with a 12 mm stem, a standard metaphyseal body, a +3 mm humeral platform, a mini baseplate, and a 38 mm concentric +5 mm laterally offset glenosphere.  Brief Clinical Note:   The patient is a 65 year old female with a long history of gradually worsening right shoulder pain and weakness. Her symptoms have progressed despite medications, activity modification, etc. Her history and examination are consistent with a massive rotator cuff tear with early cuff arthropathy, all of which were confirmed by MRI scan. The patient presents at this time for a reverse right total shoulder arthroplasty.  Procedure:   The patient underwent placement of an interscalene block using Exparel by the anesthesiologist in the preoperative holding area before being brought into the operating room and lain in the supine position. The patient then underwent general endotracheal intubation and anesthesia before the patient was repositioned in the beach chair position using the beach chair positioner. The right shoulder and upper extremity were prepped with ChloraPrep solution before being draped sterilely. Preoperative antibiotics were administered.   A timeout was performed to verify the appropriate surgical site before a standard anterior approach to the shoulder was made through an  approximately 4-5 inch incision. The incision was carried down through the subcutaneous tissues to expose the deltopectoral fascia. The interval between the deltoid and pectoralis muscles was identified and this plane developed, retracting the cephalic vein laterally with the deltoid muscle. The conjoined tendon was identified. Its lateral margin was dissected and the Kolbel self-retraining retractor inserted. The "three sisters" were identified and cauterized. Bursal tissues were removed to improve visualization. The subscapularis tendon was released from its attachment to the lesser tuberosity 1 cm proximal to its insertion and several tagging sutures placed. The inferior capsule was released with care after identifying and protecting the axillary nerve. The proximal humeral cut was made at approximately 20 of retroversion using the extra-medullary guide.   Attention was redirected to the glenoid. The labrum was debrided circumferentially before the center of the glenoid was identified. The guidewire was drilled into the glenoid neck using the appropriate guide. After verifying its position, it was overreamed with the mini-baseplate reamer to create a flat surface before the stem reamer was utilized. The superior and inferior peg sites were reamed using the appropriate guide to complete the glenoid preparation. The permanent mini-baseplate was impacted into place. It was stabilized with a 25 x 4.5 mm central screw and four peripheral screws. Locking caps were placed over the superior and inferior screws. The permanent 38 mm concentric glenosphere with +5 mm of lateral offset was then impacted into place and its Morse taper locking mechanism verified using manual distraction.  Attention was directed to the humeral side. The humeral canal was prepared utilizing the tapered stem reamers sequentially beginning  with the 7 mm stem and progressing to a 12 mm stem. This demonstrated a good tight fit. The metaphyseal  region was then prepared using the appropriate planar device. The trial stem and standard metaphyseal body were put together on the back table and a trial reduction performed using the +0 mm and +3 mm inserts. With the +3 mm insert, the arm demonstrated excellent range of motion as the hand could be brought across the chest to the opposite shoulder and brought to the top of the patient's head and to the patient's ear. The shoulder appeared stable throughout this range of motion. The joint was dislocated and the trial components removed. The permanent 12 mm stem with the standard body was impacted into place with care taken to maintain the appropriate version. A repeat trial reduction with the +3 mm insert again demonstrated excellent stability with the findings as described above. Therefore, the shoulder was re-dislocated and, after inserting the locking screw to secure the body to the stem, the permanent +3 mm insert impacted into place. After verifying its locking mechanism, the shoulder was relocated using two finger pressure and again placed through a range of motion with the findings as described above.  The wound was copiously irrigated with sterile saline solution using the jet lavage system before a total of 30 cc of 0.5% Sensorcaine with epinephrine was injected into the pericapsular and peri-incisional tissues to help with postoperative analgesia. The subscapularis tendon was reapproximated using #2 FiberWire interrupted sutures. The deltopectoral interval was closed using #0 Vicryl interrupted sutures before the subcutaneous tissues were closed using 2-0 Vicryl interrupted sutures. The skin was closed using staples. Prior to closing the skin, 1 g of transexemic acid in 10 cc of normal saline was injected intra-articularly to help with postoperative bleeding. A sterile occlusive dressing was applied to the wound before the arm was placed into a shoulder immobilizer with an abduction pillow. A Polar Care  system also was applied to the shoulder. The patient was then transferred back to a hospital bed before being awakened, extubated, and returned to the recovery room in satisfactory condition after tolerating the procedure well.

## 2021-02-20 NOTE — Anesthesia Preprocedure Evaluation (Signed)
Anesthesia Evaluation  Patient identified by MRN, date of birth, ID band Patient awake    Reviewed: Allergy & Precautions, NPO status , Patient's Chart, lab work & pertinent test results  History of Anesthesia Complications (+) PONV and history of anesthetic complications  Airway Mallampati: II  TM Distance: >3 FB Neck ROM: Full    Dental no notable dental hx.    Pulmonary shortness of breath and with exertion, former smoker,    Pulmonary exam normal        Cardiovascular negative cardio ROS Normal cardiovascular exam     Neuro/Psych  Headaches,  Neuromuscular disease negative psych ROS   GI/Hepatic Neg liver ROS, GERD  ,  Endo/Other  negative endocrine ROS  Renal/GU negative Renal ROS  negative genitourinary   Musculoskeletal  (+) Arthritis , Osteoarthritis,  Fibromyalgia -  Abdominal   Peds negative pediatric ROS (+)  Hematology negative hematology ROS (+)   Anesthesia Other Findings . Cervical dysplasia 08/95  . Chronic headaches  . Fibromyalgia  . Insomnia  . Lyme disease 07/97  . Sjogren's disease (CMS-HCC)  . Systemic lupus erythematosus (CMS-HCC)      Reproductive/Obstetrics negative OB ROS                             Anesthesia Physical Anesthesia Plan  ASA: 2  Anesthesia Plan: General   Post-op Pain Management: GA combined w/ Regional for post-op pain   Induction: Intravenous  PONV Risk Score and Plan: 2 and Propofol infusion, Ondansetron and Midazolam  Airway Management Planned: Oral ETT  Additional Equipment:   Intra-op Plan:   Post-operative Plan: Extubation in OR  Informed Consent: I have reviewed the patients History and Physical, chart, labs and discussed the procedure including the risks, benefits and alternatives for the proposed anesthesia with the patient or authorized representative who has indicated his/her understanding and acceptance.        Plan Discussed with: CRNA, Anesthesiologist and Surgeon  Anesthesia Plan Comments:         Anesthesia Quick Evaluation

## 2021-02-20 NOTE — H&P (Signed)
History of Present Illness:  Sharon Weeks is a 65 y.o. female who presents today for her surgical history and physical for upcoming right reverse total shoulder arthroplasty. Surgery is scheduled with Dr. Roland Rack on 02/20/2021. Overall the patient feels that she is doing well, pain score today is a 5 out of 10. She denies any trauma or injury affecting the right shoulder since her last evaluation. She denies any numbness or tingling to the right upper extremity. She denies any signs of infection such as fevers or any open wounds to the right arm. The patient denies any personal history of heart attack, stroke, asthma or COPD. No personal history of blood clots. She does not take any blood thinner. The patient does take 81 mg aspirin routinely but has stopped for surgery.  Shoulder Surgical History:  The patient has had no shoulder surgery in the past.  PMH/PSH/Family History/Social History/Meds/Allergies:  I have reviewed past medical, surgical, social and family history, medications and allergies as documented in the EMR.  Current Outpatient Medications:  allantoin-onion-pegs-water Gel Mederma topical gel apply to scar 3 times per day with pressure   aspirin 81 MG EC tablet Take 81 mg by mouth once daily.   cetirizine (ZYRTEC) 10 MG tablet Take 10 mg by mouth once daily   gabapentin (NEURONTIN) 400 MG capsule 400 MG 3 TIMES PER DAY, 30 DAYS 90 capsule 0   hydroxychloroquine (PLAQUENIL) 200 mg tablet 1 tab twice a day, 90 days 180 tablet 4   ibuprofen (MOTRIN) 800 MG tablet ibuprofen 800 mg tablet Take 1 tablet 3 times a day by oral route.   Allergies:   Codeine Nausea   Past Medical History:   Chest pain at rest 05/23/2018   Chronic midline low back pain without sciatica 10/03/2018   Encounter for long-term (current) use of high-risk medication 04/18/2018   Fibromyalgia   Fibromyalgia syndrome 04/18/2018   Insomnia 05/23/2018   Lupus (CMS-HCC)   Other forms of systemic lupus  erythematosus (CMS-HCC) 04/18/2018   Rotator cuff tear 04/25/2012   Screening for osteoporosis 04/18/2018 - Right wrist fracture in 2016   Sjogren's syndrome without extraglandular involvement (CMS-HCC) 04/18/2018   Past Surgical History:   back surgery   back surgery   heart catheterization   wrist surgery Right   Family History:   Cancer Mother   Diabetes Mother   Stroke Father   Myocardial Infarction (Heart attack) Father   Diabetes Sister   Heart disease Brother (CABG)   Social History:   Socioeconomic History:   Marital status: Divorced  Occupational History   Occupation: Not working right now  Tobacco Use   Smoking status: Former Smoker   Smokeless tobacco: Never Used  Scientific laboratory technician Use: Never used  Substance and Sexual Activity   Alcohol use: No   Drug use: Never   Sexual activity: Defer   Review of Systems:  A comprehensive 14 point ROS was performed, reviewed, and the pertinent orthopaedic findings are documented in the HPI.  Physical Exam:  Vitals:  02/14/21 1422  BP: 118/80  Weight: 75.2 kg (165 lb 12.8 oz)  Height: 157.5 cm (5\' 2" )  PainSc: 5  PainLoc: Shoulder   General/Constitutional: The patient appears to be well-nourished, well-developed, and in no acute distress. Neuro/Psych: Normal mood and affect, oriented to person, place and time. Eyes: Non-icteric. Pupils are equal, round, and reactive to light, and exhibit synchronous movement. ENT: Unremarkable. Lymphatic: No palpable adenopathy. Respiratory: Lungs clear to auscultation, Normal  chest excursion, No wheezes and Non-labored breathing Cardiovascular: Regular rate and rhythm. No murmurs. and No edema, swelling or tenderness, except as noted in detailed exam. Integumentary: No impressive skin lesions present, except as noted in detailed exam. Musculoskeletal: Unremarkable, except as noted in detailed exam.  Right shoulder exam: SKIN: normal SWELLING: none WARMTH: none LYMPH NODES: no  adenopathy palpable CREPITUS: none TENDERNESS: Mildly tender over anterolateral shoulder ROM (active):  Forward flexion: 155 degrees Abduction: 150 degrees Internal rotation: Right buttock ROM (passive):  Forward flexion: 165 degrees Abduction: 160 degrees  ER/IR at 90 abd: 75 degrees / 55 degrees  She experiences mild-moderate pain at the extremes of all motions.  STRENGTH: Forward flexion: 4/5 Abduction: 4/5 External rotation: 4-4+/5 Internal rotation: 4/5 Pain with RC testing: Mild pain with resisted forward flexion, abduction, and internal rotation  STABILITY: Normal  SPECIAL TESTS: Luan Pulling' test: positive, mild Speed's test: Not evaluated Capsulitis - pain w/ passive ER: no Crossed arm test: Mildly positive Crank: Not evaluated Anterior apprehension: Negative Posterior apprehension: Not evaluated  She is neurovascularly intact to the right upper extremity.  Imaging:  Shoulder X-Ray Imaging: True AP and Y-scapular views of the right shoulder are available for review and have been reviewed by myself. These films demonstrate no evidence for fractures, lytic lesions, or significant degenerative changes. The subacromial space is moderately decreased. There is a small infraclavicular spur noted. She demonstrates a Type II acromion.  Right Shoulder MRI: MRI Shoulder Cartilage: "Mild degenerative changes with a small to moderate-sized joint effusion and mild synovitis." MRI Shoulder Rotator Cuff: Full thickness tear of the supraspinatus and infraspinatus tendons with retraction to the glenoid. Significant tendinopathy with partial-thickness tearing of the subscapularis tendon. MRI Shoulder Labrum / Biceps: Biceps tendinopathy. MRI Shoulder Bone: Normal bone.  Assessment:  1. Traumatic complete tear of right rotator cuff.  2. Rotator cuff arthropathy, right.   Plan:  1. Treatment options were discussed today with the patient. 2. The patient is scheduled for a right reverse  total shoulder arthroplasty with Dr. Roland Rack on 02/20/2021. 3. The patient was instructed on the risk and benefits of surgery and wishes to proceed at this time. 4. The patient will follow-up per standard postop protocol. The patient was offered and received a right slingshot shoulder sling. 5. The patient can contact the clinic if she has any questions, new symptoms develop or symptoms worsen.  The procedure was discussed with the patient, as were the potential risks (including bleeding, infection, nerve and/or blood vessel injury, persistent or recurrent pain, loosening and/or failure of the components, dislocation, need for further surgery, blood clots, strokes, heart attacks and/or arhythmias, pneumonia, etc.) and benefits.   H&P reviewed and patient re-examined. No changes.

## 2021-02-21 ENCOUNTER — Encounter: Payer: Self-pay | Admitting: Surgery

## 2021-02-24 LAB — SURGICAL PATHOLOGY

## 2021-06-12 ENCOUNTER — Other Ambulatory Visit: Payer: Self-pay | Admitting: Family Medicine

## 2021-06-12 DIAGNOSIS — Z1231 Encounter for screening mammogram for malignant neoplasm of breast: Secondary | ICD-10-CM

## 2021-06-20 ENCOUNTER — Other Ambulatory Visit: Payer: Self-pay

## 2021-06-20 DIAGNOSIS — Z1211 Encounter for screening for malignant neoplasm of colon: Secondary | ICD-10-CM

## 2021-06-20 MED ORDER — PEG 3350-KCL-NA BICARB-NACL 420 G PO SOLR: 4000.0000 mL | Freq: Once | ORAL | 0 refills | Status: AC

## 2021-06-20 NOTE — Progress Notes (Signed)
Gastroenterology Pre-Procedure Review  Request Date: 09/15/2021 Requesting Physician: Dr. Marius Ditch  PATIENT REVIEW QUESTIONS: The patient responded to the following health history questions as indicated:    1. Are you having any GI issues? no 2. Do you have a personal history of Polyps? no 3. Do you have a family history of Colon Cancer or Polyps? yes (Sister polyps) 4. Diabetes Mellitus? no 5. Joint replacements in the past 12 months?yes (Total Right Shoulder replacement 05/19/21.) 6. Major health problems in the past 3 months?no 7. Any artificial heart valves, MVP, or defibrillator?no    MEDICATIONS & ALLERGIES:    Patient reports the following regarding taking any anticoagulation/antiplatelet therapy:   Plavix, Coumadin, Eliquis, Xarelto, Lovenox, Pradaxa, Brilinta, or Effient? no Aspirin? yes (81 mg)  Patient confirms/reports the following medications:  Current Outpatient Medications  Medication Sig Dispense Refill   aspirin 81 MG tablet Take 81 mg by mouth daily.     cetirizine (ZYRTEC) 10 MG tablet Take 5 mg by mouth at bedtime.     diphenhydrAMINE (BENADRYL) 25 MG tablet Take 25 mg by mouth every 6 (six) hours as needed.     gabapentin (NEURONTIN) 400 MG capsule Take 400 mg by mouth 3 (three) times daily.     HYDROcodone-acetaminophen (NORCO) 5-325 MG tablet Take 1-2 tablets by mouth every 6 (six) hours as needed for moderate pain or severe pain. MAXIMUM TOTAL ACETAMINOPHEN DOSE IS 4000 MG PER DAY 40 tablet 0   hydroxychloroquine (PLAQUENIL) 200 MG tablet Take 200 mg by mouth 2 (two) times daily.     ibuprofen (ADVIL) 800 MG tablet Take 800 mg by mouth every 8 (eight) hours as needed.     ondansetron (ZOFRAN ODT) 4 MG disintegrating tablet Take 1 tablet (4 mg total) by mouth every 6 (six) hours as needed for nausea or vomiting. 20 tablet 1   No current facility-administered medications for this visit.    Patient confirms/reports the following allergies:  Allergies  Allergen  Reactions   Codeine     shaky    No orders of the defined types were placed in this encounter.   AUTHORIZATION INFORMATION Primary Insurance: 1D#: Group #:  Secondary Insurance: 1D#: Group #:  SCHEDULE INFORMATION: Date: 09/15/2021 Time: Location: [place]

## 2021-06-25 ENCOUNTER — Encounter: Payer: Self-pay | Admitting: Family Medicine

## 2021-06-30 ENCOUNTER — Ambulatory Visit
Admission: RE | Admit: 2021-06-30 | Discharge: 2021-06-30 | Disposition: A | Discharge status: Home / Self Care | Payer: Medicare PPO | Source: Ambulatory Visit | Attending: Family Medicine | Admitting: Family Medicine

## 2021-06-30 ENCOUNTER — Other Ambulatory Visit: Payer: Self-pay

## 2021-06-30 DIAGNOSIS — Z1231 Encounter for screening mammogram for malignant neoplasm of breast: Secondary | ICD-10-CM | POA: Diagnosis present

## 2021-07-07 ENCOUNTER — Other Ambulatory Visit: Payer: Self-pay | Admitting: Family Medicine

## 2021-07-07 DIAGNOSIS — R928 Other abnormal and inconclusive findings on diagnostic imaging of breast: Secondary | ICD-10-CM

## 2021-07-07 DIAGNOSIS — N6310 Unspecified lump in the right breast, unspecified quadrant: Secondary | ICD-10-CM

## 2021-07-16 ENCOUNTER — Other Ambulatory Visit: Payer: Self-pay

## 2021-07-16 ENCOUNTER — Ambulatory Visit
Admission: RE | Admit: 2021-07-16 | Discharge: 2021-07-16 | Disposition: A | Discharge status: Home / Self Care | Payer: Medicare PPO | Source: Ambulatory Visit | Attending: Family Medicine | Admitting: Family Medicine

## 2021-07-16 DIAGNOSIS — N6310 Unspecified lump in the right breast, unspecified quadrant: Secondary | ICD-10-CM | POA: Diagnosis present

## 2021-07-16 DIAGNOSIS — R928 Other abnormal and inconclusive findings on diagnostic imaging of breast: Secondary | ICD-10-CM | POA: Diagnosis present

## 2021-07-17 ENCOUNTER — Other Ambulatory Visit: Payer: Self-pay | Admitting: Family Medicine

## 2021-07-17 DIAGNOSIS — N6310 Unspecified lump in the right breast, unspecified quadrant: Secondary | ICD-10-CM

## 2021-07-23 ENCOUNTER — Other Ambulatory Visit: Payer: Self-pay | Admitting: Family Medicine

## 2021-07-23 DIAGNOSIS — R928 Other abnormal and inconclusive findings on diagnostic imaging of breast: Secondary | ICD-10-CM

## 2021-07-23 DIAGNOSIS — N6310 Unspecified lump in the right breast, unspecified quadrant: Secondary | ICD-10-CM

## 2021-07-29 ENCOUNTER — Other Ambulatory Visit: Payer: Self-pay

## 2021-07-29 ENCOUNTER — Ambulatory Visit
Admission: RE | Admit: 2021-07-29 | Discharge: 2021-07-29 | Disposition: A | Discharge status: Home / Self Care | Payer: Medicare PPO | Source: Ambulatory Visit | Attending: Family Medicine | Admitting: Family Medicine

## 2021-07-29 DIAGNOSIS — R928 Other abnormal and inconclusive findings on diagnostic imaging of breast: Secondary | ICD-10-CM | POA: Diagnosis present

## 2021-07-29 DIAGNOSIS — N6310 Unspecified lump in the right breast, unspecified quadrant: Secondary | ICD-10-CM | POA: Insufficient documentation

## 2021-07-29 HISTORY — PX: BREAST BIOPSY: SHX20

## 2021-07-30 LAB — SURGICAL PATHOLOGY

## 2021-09-15 ENCOUNTER — Encounter
Admission: RE | Disposition: A | Discharge status: Home / Self Care | Payer: Self-pay | Source: Home / Self Care | Attending: Gastroenterology

## 2021-09-15 ENCOUNTER — Encounter: Payer: Self-pay | Admitting: Gastroenterology

## 2021-09-15 ENCOUNTER — Ambulatory Visit: Payer: Medicare PPO | Admitting: Anesthesiology

## 2021-09-15 ENCOUNTER — Ambulatory Visit
Admission: RE | Admit: 2021-09-15 | Discharge: 2021-09-15 | Disposition: A | Discharge status: Home / Self Care | Payer: Medicare PPO | Attending: Gastroenterology | Admitting: Gastroenterology

## 2021-09-15 DIAGNOSIS — M329 Systemic lupus erythematosus, unspecified: Secondary | ICD-10-CM | POA: Diagnosis not present

## 2021-09-15 DIAGNOSIS — K635 Polyp of colon: Secondary | ICD-10-CM

## 2021-09-15 DIAGNOSIS — D123 Benign neoplasm of transverse colon: Secondary | ICD-10-CM | POA: Insufficient documentation

## 2021-09-15 DIAGNOSIS — M3500 Sicca syndrome, unspecified: Secondary | ICD-10-CM | POA: Diagnosis not present

## 2021-09-15 DIAGNOSIS — K219 Gastro-esophageal reflux disease without esophagitis: Secondary | ICD-10-CM | POA: Insufficient documentation

## 2021-09-15 DIAGNOSIS — M797 Fibromyalgia: Secondary | ICD-10-CM | POA: Diagnosis not present

## 2021-09-15 DIAGNOSIS — D122 Benign neoplasm of ascending colon: Secondary | ICD-10-CM | POA: Insufficient documentation

## 2021-09-15 DIAGNOSIS — Z87891 Personal history of nicotine dependence: Secondary | ICD-10-CM | POA: Diagnosis not present

## 2021-09-15 DIAGNOSIS — Z1211 Encounter for screening for malignant neoplasm of colon: Secondary | ICD-10-CM | POA: Insufficient documentation

## 2021-09-15 DIAGNOSIS — K5730 Diverticulosis of large intestine without perforation or abscess without bleeding: Secondary | ICD-10-CM | POA: Insufficient documentation

## 2021-09-15 HISTORY — PX: COLONOSCOPY WITH PROPOFOL: SHX5780

## 2021-09-15 SURGERY — COLONOSCOPY WITH PROPOFOL
Anesthesia: General

## 2021-09-15 MED ORDER — PROPOFOL 10 MG/ML IV BOLUS
INTRAVENOUS | Status: DC | PRN
Start: 2021-09-15 — End: 2021-09-15
  Administered 2021-09-15: 20 mg via INTRAVENOUS
  Administered 2021-09-15: 10 mg via INTRAVENOUS
  Administered 2021-09-15: 50 mg via INTRAVENOUS

## 2021-09-15 MED ORDER — SODIUM CHLORIDE 0.9 % IV SOLN: INTRAVENOUS | Status: DC

## 2021-09-15 MED ORDER — PROPOFOL 500 MG/50ML IV EMUL
INTRAVENOUS | Status: DC | PRN
Start: 2021-09-15 — End: 2021-09-15
  Administered 2021-09-15: 200 ug/kg/min via INTRAVENOUS

## 2021-09-15 NOTE — Anesthesia Preprocedure Evaluation (Signed)
Anesthesia Evaluation  Patient identified by MRN, date of birth, ID band Patient awake    Reviewed: Allergy & Precautions, NPO status , Patient's Chart, lab work & pertinent test results  History of Anesthesia Complications (+) PONV and history of anesthetic complications  Airway Mallampati: II  TM Distance: >3 FB Neck ROM: Full    Dental  (+) Dental Advidsory Given, Teeth Intact   Pulmonary neg pulmonary ROS, neg shortness of breath, neg sleep apnea, neg COPD, neg recent URI, former smoker,    Pulmonary exam normal        Cardiovascular negative cardio ROS Normal cardiovascular exam     Neuro/Psych  Headaches, neg Seizures  Neuromuscular disease negative psych ROS   GI/Hepatic Neg liver ROS, GERD  ,  Endo/Other  negative endocrine ROS  Renal/GU negative Renal ROS  negative genitourinary   Musculoskeletal  (+) Arthritis , Osteoarthritis,  Fibromyalgia -  Abdominal   Peds negative pediatric ROS (+)  Hematology negative hematology ROS (+)   Anesthesia Other Findings  Cervical dysplasia 08/95   Chronic headaches   Fibromyalgia   Insomnia   Lyme disease 07/97   Sjogren's disease (CMS-HCC)   Systemic lupus erythematosus (CMS-HCC)      Reproductive/Obstetrics negative OB ROS                             Anesthesia Physical  Anesthesia Plan  ASA: 2  Anesthesia Plan: General   Post-op Pain Management:    Induction: Intravenous  PONV Risk Score and Plan: 4 or greater and Propofol infusion and TIVA  Airway Management Planned: Natural Airway and Nasal Cannula  Additional Equipment:   Intra-op Plan:   Post-operative Plan: Extubation in OR  Informed Consent: I have reviewed the patients History and Physical, chart, labs and discussed the procedure including the risks, benefits and alternatives for the proposed anesthesia with the patient or authorized representative who has  indicated his/her understanding and acceptance.       Plan Discussed with: CRNA, Anesthesiologist and Surgeon  Anesthesia Plan Comments:         Anesthesia Quick Evaluation

## 2021-09-15 NOTE — H&P (Signed)
Cephas Darby, MD [address]  [place]  [place], [place] [address]  Main: [phone]  Fax: [phone] Pager: [phone]  Primary Care Physician:  Lynnell Jude, MD Primary Gastroenterologist:  Dr. Cephas Darby  Pre-Procedure History & Physical: HPI:  Sharon Weeks is a 66 y.o. female is here for an colonoscopy.   Past Medical History:  Diagnosis Date   Fibromyalgia    GERD (gastroesophageal reflux disease)    Lupus (HCC)    Lupus (HCC)    PONV (postoperative nausea and vomiting)    Rotator cuff tear, right 2013   Sjogren's syndrome without extraglandular involvement (HCC)    Wrist fracture, right     Past Surgical History:  Procedure Laterality Date   BACK SURGERY  12/2010   L4-L5 decompression and L5-S1 laminectomy and fusion   BREAST BIOPSY Right 07/29/2021   [place] bx, coil marker, path pending   CARDIAC CATHETERIZATION  07/25/2018   Dr. Caryl Comes [facility]   COLONOSCOPY     REVERSE SHOULDER ARTHROPLASTY Right 02/20/2021   Procedure: REVERSE SHOULDER ARTHROPLASTY;  Surgeon: Corky Mull, MD;  Location: [facility] ORS;  Service: Orthopedics;  Laterality: Right;   WRIST FRACTURE SURGERY Right    2018    Prior to Admission medications   Medication Sig Start Date End Date Taking? Authorizing Provider  cetirizine (ZYRTEC) 10 MG tablet Take 5 mg by mouth at bedtime. 01/25/18  Yes [provider]  diphenhydrAMINE (BENADRYL) 25 MG tablet Take 25 mg by mouth every 6 (six) hours as needed.   Yes [provider]  gabapentin (NEURONTIN) 400 MG capsule Take 400 mg by mouth 3 (three) times daily. 12/10/12  Yes [provider]  hydroxychloroquine (PLAQUENIL) 200 MG tablet Take 200 mg by mouth 2 (two) times daily.   Yes [provider]  ibuprofen (ADVIL) 800 MG tablet Take 800 mg by mouth every 8 (eight) hours as needed.   Yes [provider]  aspirin 81 MG tablet Take 81 mg by mouth daily.    [provider]   HYDROcodone-acetaminophen (NORCO) 5-325 MG tablet Take 1-2 tablets by mouth every 6 (six) hours as needed for moderate pain or severe pain. MAXIMUM TOTAL ACETAMINOPHEN DOSE IS 4000 MG PER DAY Patient not taking: Reported on 09/15/2021 02/20/21   Poggi, Marshall Cork, MD  ondansetron (ZOFRAN ODT) 4 MG disintegrating tablet Take 1 tablet (4 mg total) by mouth every 6 (six) hours as needed for nausea or vomiting. 02/20/21   Poggi, Marshall Cork, MD  nitroGLYCERIN (NITROSTAT) 0.4 MG SL tablet Place 0.4 mg under the tongue every 5 (five) minutes as needed for chest pain.  08/29/19  [provider]  ranitidine (ZANTAC) 150 MG capsule Take 150 mg by mouth 2 (two) times daily.  08/29/19  [provider]    Allergies as of 06/21/2021 - Review Complete 02/20/2021  Allergen Reaction Noted   Codeine  12/19/2012    Family History  Problem Relation Age of Onset   Breast cancer Mother    Heart attack Father    Heart failure Sister     Social History   Socioeconomic History   Marital status: Single    Spouse name: Not on file   Number of children: 1   Years of education: Not on file   Highest education level: Not on file  Occupational History   Not on file  Tobacco Use   Smoking status: Former    Packs/day: 0.50  Years: 12.00    Pack years: 6.00    Types: Cigarettes    Quit date: 12/20/1985    Years since quitting: 35.7   Smokeless tobacco: Never  Vaping Use   Vaping Use: Never used  Substance and Sexual Activity   Alcohol use: No   Drug use: No   Sexual activity: Not on file  Other Topics Concern   Not on file  Social History Narrative   Lives with fiance   Daughter is nurse in [place] Determinants of Health   Financial Resource Strain: Not on file  Food Insecurity: Not on file  Transportation Needs: Not on file  Physical Activity: Not on file  Stress: Not on file  Social Connections: Not on file  Intimate Partner Violence: Not on file    Review of  Systems: See HPI, otherwise negative ROS  Physical Exam: BP 121/65    Pulse 61    Temp (!) 96.6 F (35.9 C) (Temporal)    Resp 16    Ht 5\' 2"  (1.575 m)    Wt 72.7 kg    SpO2 99%    BMI 29.32 kg/m  General:   Alert,  pleasant and cooperative in NAD Head:  Normocephalic and atraumatic. Neck:  Supple; no masses or thyromegaly. Lungs:  Clear throughout to auscultation.    Heart:  Regular rate and rhythm. Abdomen:  Soft, nontender and nondistended. Normal bowel sounds, without guarding, and without rebound.   Neurologic:  Alert and  oriented x4;  grossly normal neurologically.  Impression/Plan: Sharon Weeks is here for an colonoscopy to be performed for colon cancer screening  Risks, benefits, limitations, and alternatives regarding  colonoscopy have been reviewed with the patient.  Questions have been answered.  All parties agreeable.   Sherri Sear, MD  09/15/2021, 9:13 AM

## 2021-09-15 NOTE — Anesthesia Procedure Notes (Signed)
Date/Time: 09/15/2021 9:55 AM Performed by: Nelda Marseille, CRNA Oxygen Delivery Method: Nasal cannula

## 2021-09-15 NOTE — Transfer of Care (Signed)
Immediate Anesthesia Transfer of Care Note  Patient: Sharon Weeks  Procedure(s) Performed: COLONOSCOPY WITH PROPOFOL  Patient Location: PACU  Anesthesia Type:General  Level of Consciousness: awake, alert  and oriented  Airway & Oxygen Therapy: Patient Spontanous Breathing and Patient connected to nasal cannula oxygen  Post-op Assessment: Report given to RN and Post -op Vital signs reviewed and stable  Post vital signs: Reviewed and stable  Last Vitals:  Vitals Value Taken Time  BP 118/79 09/15/21 1013  Temp    Pulse 87 09/15/21 1014  Resp 17 09/15/21 1014  SpO2 100 % 09/15/21 1014  Vitals shown include unvalidated device data.  Last Pain:  Vitals:   09/15/21 0858  TempSrc: Temporal  PainSc: 0-No pain         Complications: No notable events documented.

## 2021-09-15 NOTE — Op Note (Signed)
[facility] Gastroenterology Patient Name: Sharon Weeks Procedure Date: 09/15/2021 9:16 AM MRN: [id number] Account #: [id number] Date of Birth: 1956/01/16 Admit Type: Outpatient Age: 66 Room: [facility] ENDO ROOM 1 Gender: Female Note Status: Finalized Instrument Name: Colonoscope [id number] Procedure:             Colonoscopy Indications:           Screening for colorectal malignant neoplasm Providers:             Lin Landsman MD, MD Referring MD:          Lynnell Jude (Referring MD) Medicines:             General Anesthesia Complications:         No immediate complications. Estimated blood loss: None. Procedure:             Pre-Anesthesia Assessment:                        - Prior to the procedure, a History and Physical was                         performed, and patient medications and allergies were                         reviewed. The patient is competent. The risks and                         benefits of the procedure and the sedation options and                         risks were discussed with the patient. All questions                         were answered and informed consent was obtained.                         Patient identification and proposed procedure were                         verified by the physician, the nurse, the                         anesthesiologist, the anesthetist and the technician                         in the pre-procedure area in the procedure room in the                         endoscopy suite. Mental Status Examination: alert and                         oriented. Airway Examination: normal oropharyngeal                         airway and neck mobility. Respiratory Examination:                         clear to auscultation. CV Examination: normal.  Prophylactic Antibiotics: The patient does not require                         prophylactic antibiotics. Prior Anticoagulants: The                          patient has taken no previous anticoagulant or                         antiplatelet agents. ASA Grade Assessment: II - A                         patient with mild systemic disease. After reviewing                         the risks and benefits, the patient was deemed in                         satisfactory condition to undergo the procedure. The                         anesthesia plan was to use general anesthesia.                         Immediately prior to administration of medications,                         the patient was re-assessed for adequacy to receive                         sedatives. The heart rate, respiratory rate, oxygen                         saturations, blood pressure, adequacy of pulmonary                         ventilation, and response to care were monitored                         throughout the procedure. The physical status of the                         patient was re-assessed after the procedure.                        After obtaining informed consent, the colonoscope was                         passed under direct vision. Throughout the procedure,                         the patient's blood pressure, pulse, and oxygen                         saturations were monitored continuously. The                         Colonoscope was introduced through the anus and  advanced to the the cecum, identified by appendiceal                         orifice and ileocecal valve. The colonoscopy was                         performed with moderate difficulty due to multiple                         diverticula in the colon. Successful completion of the                         procedure was aided by applying abdominal pressure.                         The patient tolerated the procedure well. The quality                         of the bowel preparation was evaluated using the BBPS                         [facility] Bowel Preparation Scale) with scores of: Right                          Colon = 3, Transverse Colon = 3 and Left Colon = 3                         (entire mucosa seen well with no residual staining,                         small fragments of stool or opaque liquid). The total                         BBPS score equals 9. Findings:      The perianal and digital rectal examinations were normal. Pertinent       negatives include normal sphincter tone and no palpable rectal lesions.      A 3 mm polyp was found in the ascending colon. The polyp was sessile.       The polyp was removed with a jumbo cold forceps. Resection and retrieval       were complete. Estimated blood loss: none.      A 5 mm polyp was found in the transverse colon. The polyp was sessile.       The polyp was removed with a cold snare. Resection and retrieval were       complete. Estimated blood loss: none.      Multiple diverticula were found in the recto-sigmoid colon and sigmoid       colon.      The retroflexed view of the distal rectum and anal verge was normal and       showed no anal or rectal abnormalities. Impression:            - One 3 mm polyp in the ascending colon, removed with                         a jumbo cold forceps. Resected and retrieved.                        -  One 5 mm polyp in the transverse colon, removed with                         a cold snare. Resected and retrieved.                        - Diverticulosis in the recto-sigmoid colon and in the                         sigmoid colon.                        - The distal rectum and anal verge are normal on                         retroflexion view. Recommendation:        - Discharge patient to home (with escort).                        - Resume previous diet today.                        - Continue present medications.                        - Await pathology results.                        - Repeat colonoscopy in 5 to 7 years for surveillance                         based on pathology  results. Procedure Code(s):     --- Professional ---                        [phone], Colonoscopy, flexible; with removal of                         tumor(s), polyp(s), or other lesion(s) by snare                         technique                        45380, 25, Colonoscopy, flexible; with biopsy, single                         or multiple Diagnosis Code(s):     --- Professional ---                        Z12.11, Encounter for screening for malignant neoplasm                         of colon                        K63.5, Polyp of colon                        K57.30, Diverticulosis of large intestine without  perforation or abscess without bleeding CPT copyright 2019 American Medical Association. All rights reserved. The codes documented in this report are preliminary and upon coder review may  be revised to meet current compliance requirements. Dr. Ulyess Mort Lin Landsman MD, MD 09/15/2021 10:11:35 AM This report has been signed electronically. Number of Addenda: 0 Note Initiated On: 09/15/2021 9:16 AM Scope Withdrawal Time: 0 hours 12 minutes 42 seconds  Total Procedure Duration: 0 hours 17 minutes 18 seconds  Estimated Blood Loss:  Estimated blood loss: none.      [facility]

## 2021-09-16 ENCOUNTER — Other Ambulatory Visit: Payer: Self-pay | Admitting: Neurosurgery

## 2021-09-16 DIAGNOSIS — Z981 Arthrodesis status: Secondary | ICD-10-CM

## 2021-09-16 DIAGNOSIS — M5416 Radiculopathy, lumbar region: Secondary | ICD-10-CM

## 2021-09-16 DIAGNOSIS — M5136 Other intervertebral disc degeneration, lumbar region: Secondary | ICD-10-CM

## 2021-09-16 LAB — SURGICAL PATHOLOGY

## 2021-09-16 NOTE — Anesthesia Postprocedure Evaluation (Signed)
Anesthesia Post Note  Patient: [name]  Procedure(s) Performed: COLONOSCOPY WITH PROPOFOL  Patient location during evaluation: Endoscopy Anesthesia Type: General Level of consciousness: awake and alert Pain management: pain level controlled Vital Signs Assessment: post-procedure vital signs reviewed and stable Respiratory status: spontaneous breathing, nonlabored ventilation, respiratory function stable and patient connected to nasal cannula oxygen Cardiovascular status: blood pressure returned to baseline and stable Postop Assessment: no apparent nausea or vomiting Anesthetic complications: no   No notable events documented.   Last Vitals:  Vitals:   09/15/21 0858 09/15/21 1013  BP: 121/65 118/79  Pulse: 61   Resp: 16   Temp: (!) 35.9 C (!) 35.9 C  SpO2: 99%     Last Pain:  Vitals:   09/16/21 0733  TempSrc:   PainSc: 0-No pain                 Martha Clan

## 2021-09-17 ENCOUNTER — Encounter: Payer: Self-pay | Admitting: Gastroenterology

## 2021-09-24 ENCOUNTER — Ambulatory Visit
Admission: RE | Admit: 2021-09-24 | Discharge: 2021-09-24 | Disposition: A | Discharge status: Home / Self Care | Payer: Medicare PPO | Source: Ambulatory Visit | Attending: Neurosurgery | Admitting: Neurosurgery

## 2021-09-24 ENCOUNTER — Other Ambulatory Visit: Payer: Self-pay

## 2021-09-24 DIAGNOSIS — Z981 Arthrodesis status: Secondary | ICD-10-CM | POA: Insufficient documentation

## 2021-09-24 DIAGNOSIS — M5136 Other intervertebral disc degeneration, lumbar region: Secondary | ICD-10-CM | POA: Diagnosis present

## 2021-09-24 DIAGNOSIS — M5416 Radiculopathy, lumbar region: Secondary | ICD-10-CM | POA: Diagnosis present

## 2022-01-12 ENCOUNTER — Other Ambulatory Visit: Payer: Self-pay | Admitting: Rheumatology

## 2022-01-12 DIAGNOSIS — Z780 Asymptomatic menopausal state: Secondary | ICD-10-CM

## 2022-02-17 ENCOUNTER — Ambulatory Visit
Admission: RE | Admit: 2022-02-17 | Discharge: 2022-02-17 | Disposition: A | Discharge status: Home / Self Care | Payer: Medicare PPO | Source: Ambulatory Visit | Attending: Rheumatology | Admitting: Rheumatology

## 2022-02-17 DIAGNOSIS — Z780 Asymptomatic menopausal state: Secondary | ICD-10-CM | POA: Insufficient documentation

## 2022-03-23 IMAGING — MG US  BREAST BX W/ LOC DEV 1ST LESION IMG BX SPEC US GUIDE*R*
1 series · 8 of 8 positions shown · non-contrast
Comparison: Previous exam(s).
COMPARISON: Previous exam(s).

Addendum:
CLINICAL DATA: 65-year-old female presenting for ultrasound-guided
biopsy of a right breast mass.

EXAM:
ULTRASOUND GUIDED RIGHT BREAST CORE NEEDLE BIOPSY

[Series 1: MG view · 0.07mm/px · 8 of 10 slices shown]
[im 1/10]
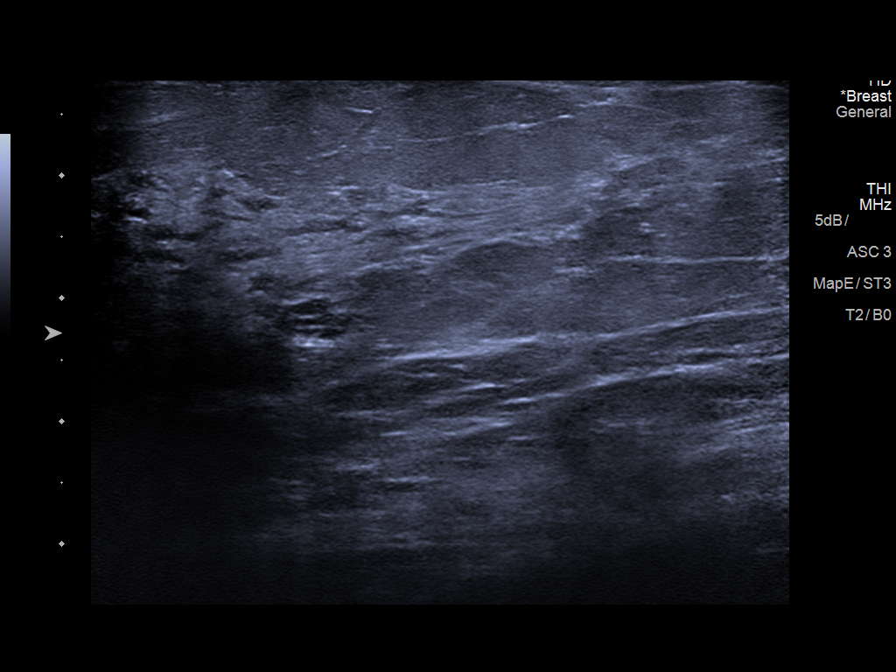
[im 2/10]
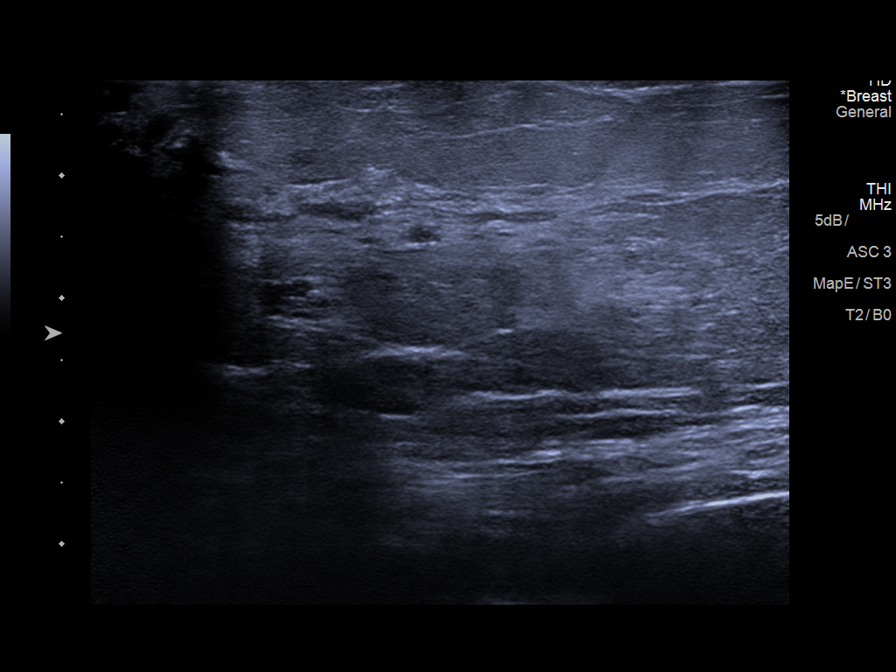
[im 3/10]
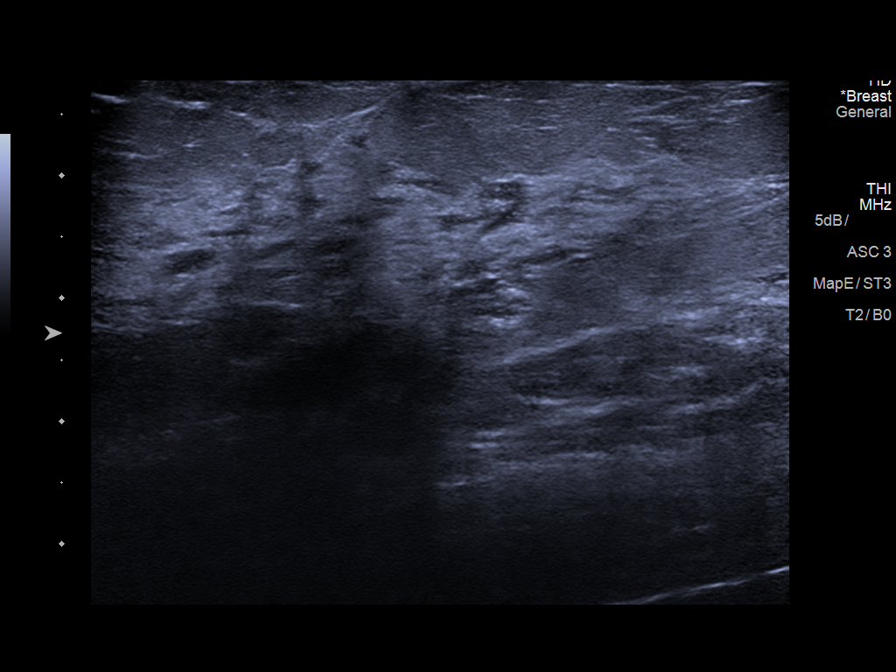
[im 4/10]
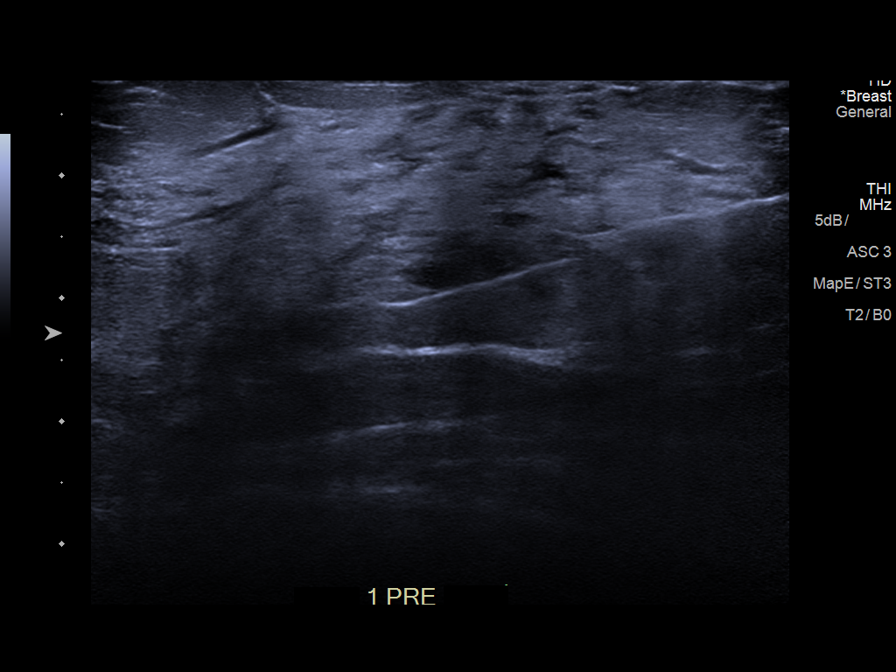
[im 6/10]
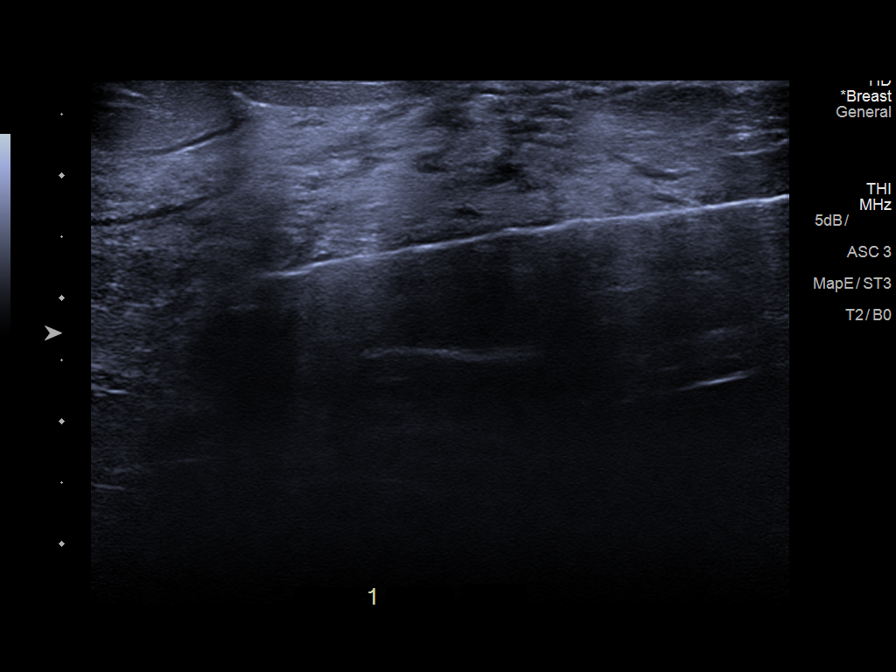
[im 7/10]
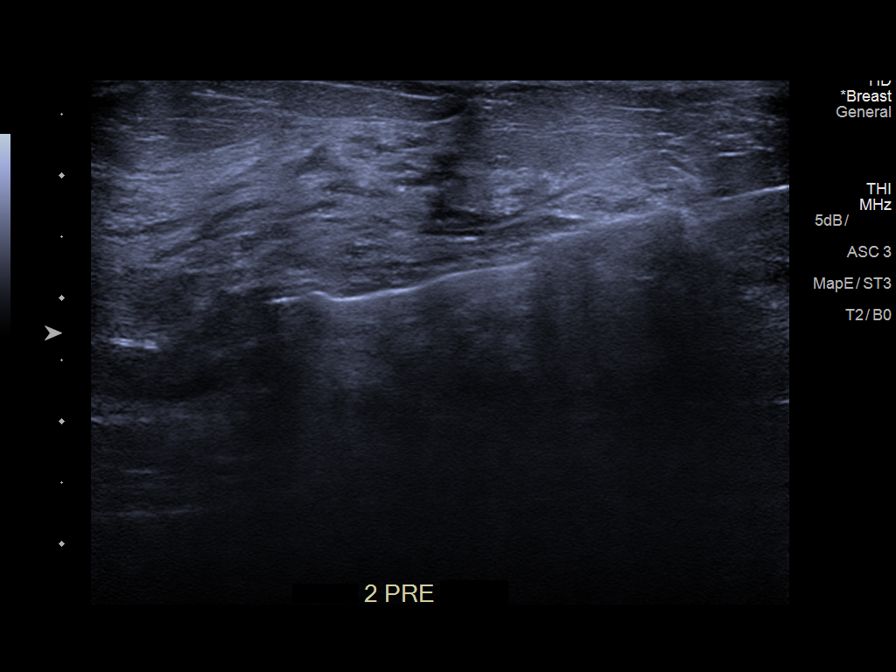
[im 8/10]
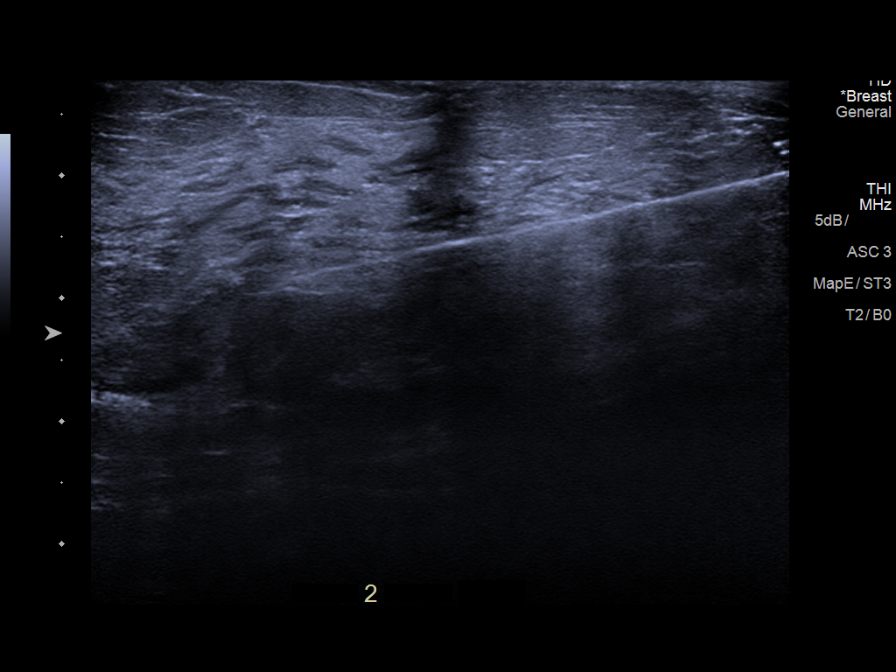
[im 10/10]
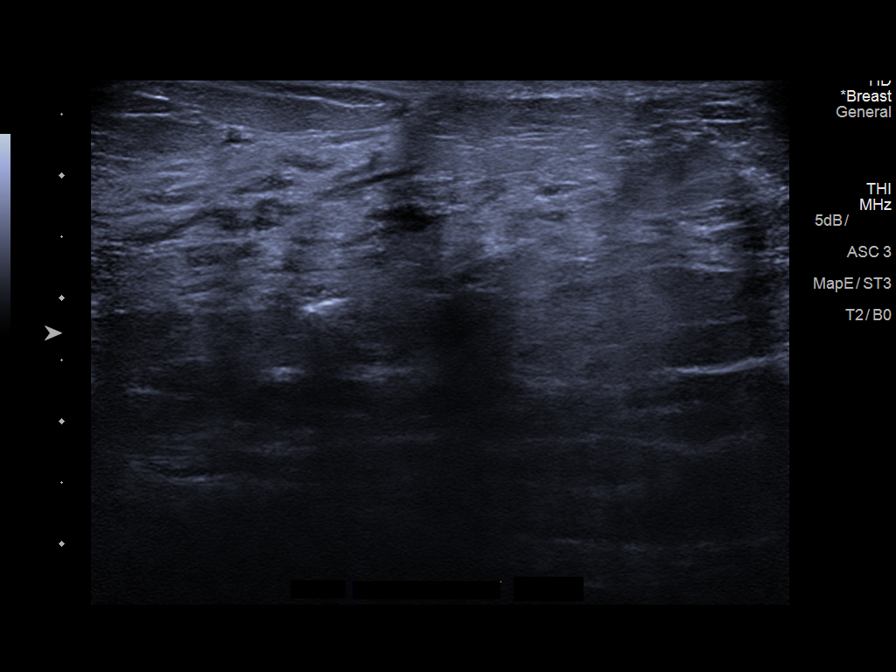

[8 of 8 positions shown; findings below may reference images not displayed]



Lesion quadrant: Upper inner quadrant

Using sterile technique and 1% Lidocaine as local anesthetic, under
direct ultrasound visualization, a 12 gauge Bascou device was
used to perform biopsy of a mass in the right breast at 12 o'clock,
1 cm from the nipple using a medial approach. At the conclusion of
the procedure a coil shaped tissue marker clip was deployed into the
biopsy cavity. Follow up 2 view mammogram was performed and dictated
separately.
IMPRESSION: Ultrasound guided biopsy of a right breast mass at 12 o'clock. No
apparent complications.

ADDENDUM:
PATHOLOGY revealed: A. BREAST, RIGHT AT [DATE], 1 CM FROM NIPPLE;
ULTRASOUND-GUIDED CORE NEEDLE BIOPSY: - BENIGN MAMMARY PARENCHYMA
WITH FIBROCYSTIC AND PAPILLARY APOCRINE CHANGES. - NEGATIVE FOR
ATYPICAL PROLIFERATIVE BREAST DISEASE.

Pathology results are CONCORDANT with imaging findings, per Dr.
Euclides Roberge.

Pathology results and recommendations were discussed with patient
via telephone on 07/30/2021. Patient reported biopsy site doing well
with no adverse symptoms, and only slight tenderness at the site.
Post biopsy care instructions were reviewed, questions were answered
and my direct phone number was provided. Patient was instructed to
call [HOSPITAL] for any additional questions or concerns
related to biopsy site.

RECOMMENDATION: Patient instructed to continue monthly self breast
examinations and resume annual bilateral screening mammogram due
Friday July, 2022.

Pathology results reported by Maniuc Gutium RN on 07/31/2021.



Lesion quadrant: Upper inner quadrant

Using sterile technique and 1% Lidocaine as local anesthetic, under
direct ultrasound visualization, a 12 gauge Bascou device was
used to perform biopsy of a mass in the right breast at 12 o'clock,
1 cm from the nipple using a medial approach. At the conclusion of
the procedure a coil shaped tissue marker clip was deployed into the
biopsy cavity. Follow up 2 view mammogram was performed and dictated
separately.
IMPRESSION: Ultrasound guided biopsy of a right breast mass at 12 o'clock. No
apparent complications.

## 2022-05-19 IMAGING — MR MR LUMBAR SPINE W/O CM
4 of 5 series · 30 of 48 positions shown · non-contrast
Comparison: Lumbar spine radiographs 07/05/2017

CLINICAL DATA: Low back pain radiating down both legs for 2 years,
history of surgery in 1311

EXAM:
MRI LUMBAR SPINE WITHOUT CONTRAST
TECHNIQUE: Multiplanar, multisequence MR imaging of the lumbar spine was
performed. No intravenous contrast was administered.

[Series 5: T2 · sagittal · 4.0mm · 0.88mm/px · 6 of 15 slices shown (1 of 2)]
[im 1/15]
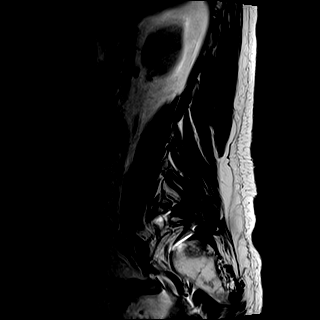
[im 3/15]
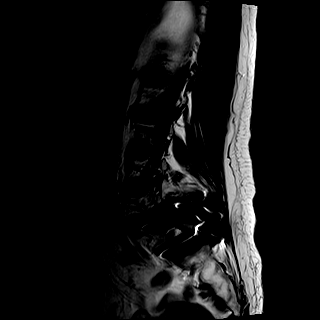
[im 6/15]
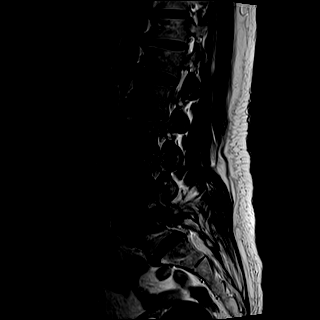
[im 9/15]
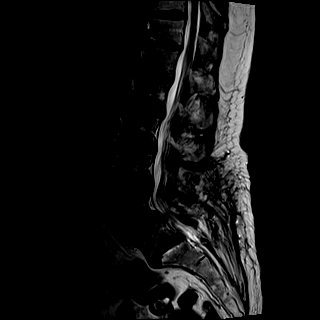
[im 12/15]
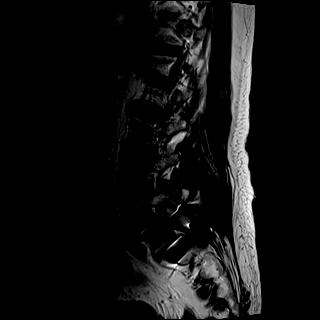
[im 15/15]
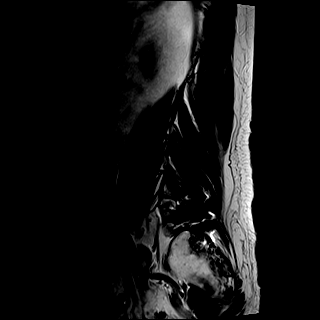

[Series 6: T1 · sagittal · 4.0mm · 0.88mm/px · 6 of 15 slices shown (1 of 2)]
[im 1/15]
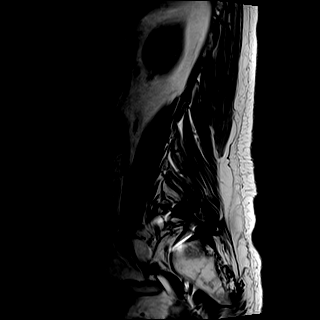
[im 3/15]
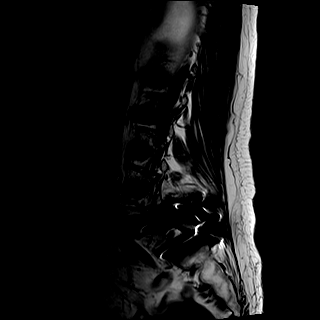
[im 6/15]
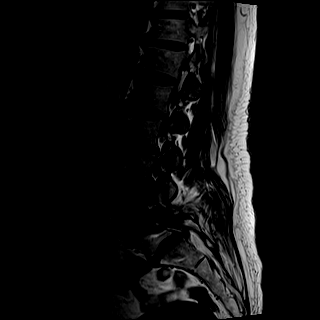
[im 9/15]
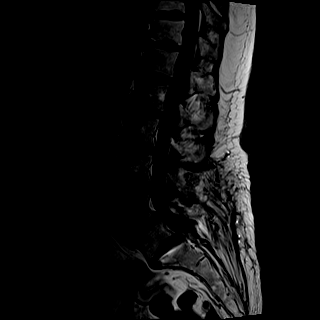
[im 12/15]
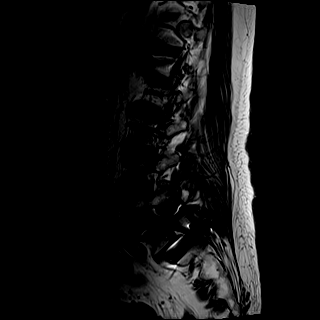
[im 15/15]
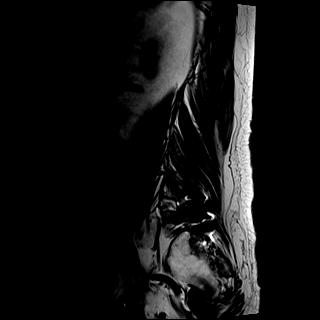

[Series 8: T2 · axial · 4.0mm · 0.78mm/px · z∈[-104,+131]mm · 9 of 37 slices shown (2 of 2)]
[im 1/37]
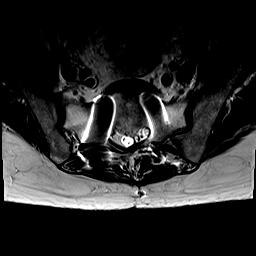
[im 6/37]
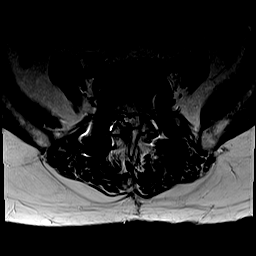
[im 11/37]
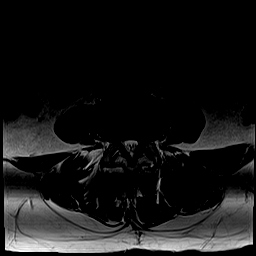
[im 16/37]
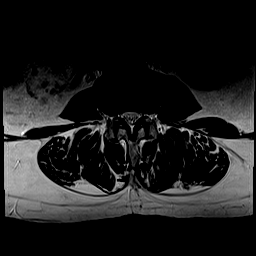
[im 19/37]
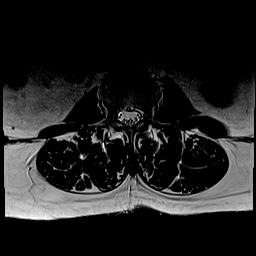
[im 21/37]
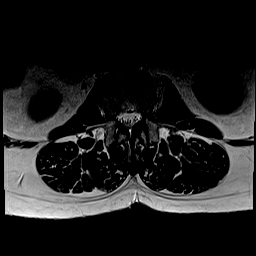
[im 26/37]
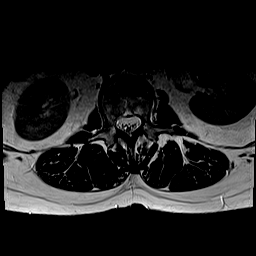
[im 31/37]
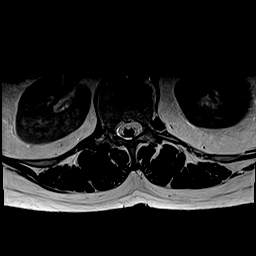
[im 37/37]
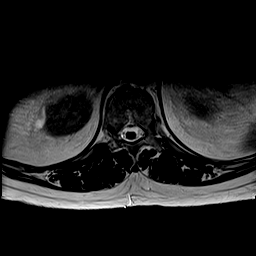

[Series 9: T1 · axial · 4.0mm · 0.39mm/px · z∈[-104,+131]mm · 9 of 37 slices shown (2 of 2)]
[im 1/37]
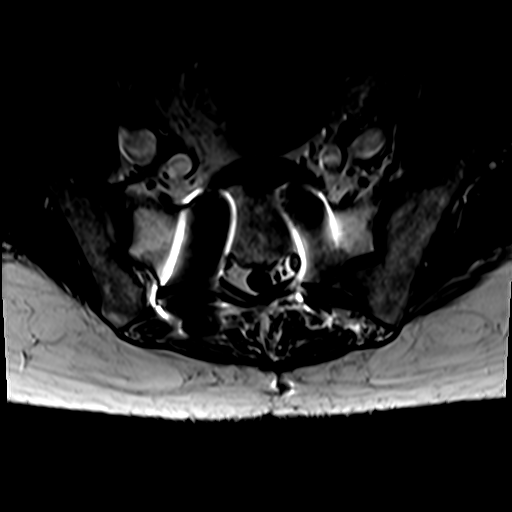
[im 6/37]
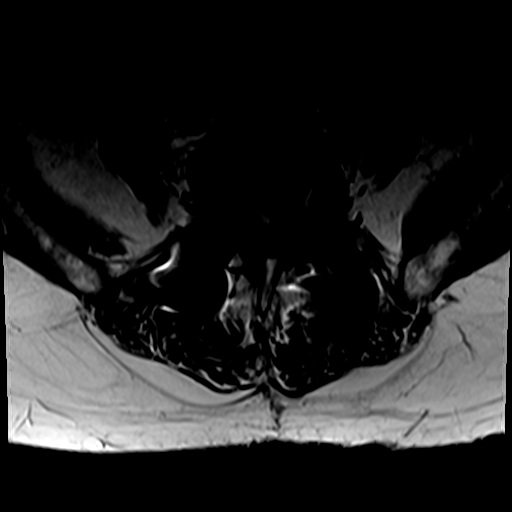
[im 11/37]
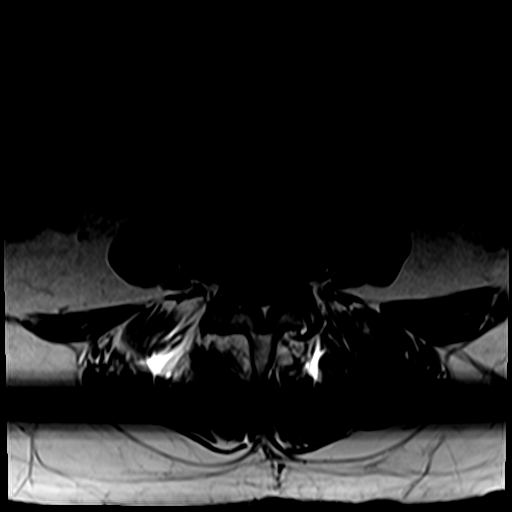
[im 16/37]
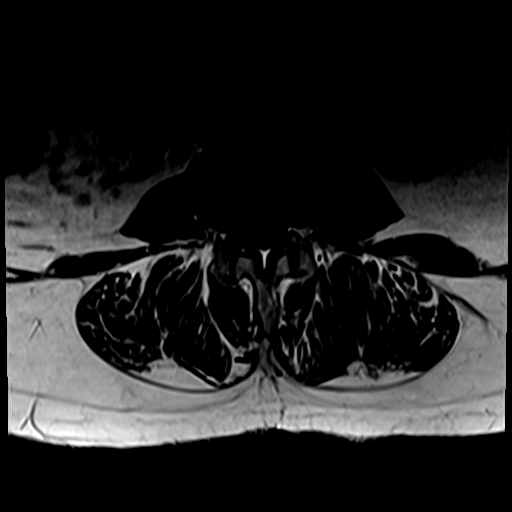
[im 19/37]
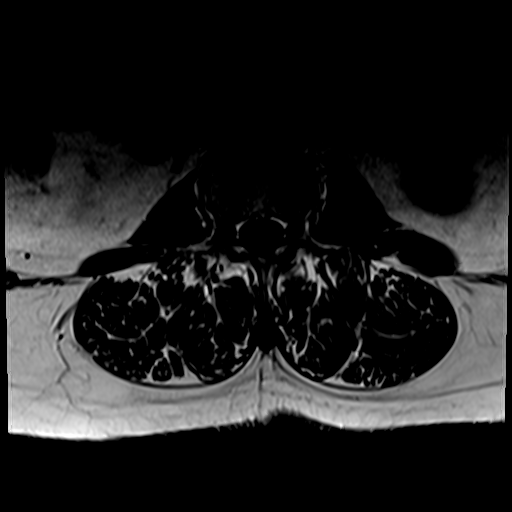
[im 21/37]
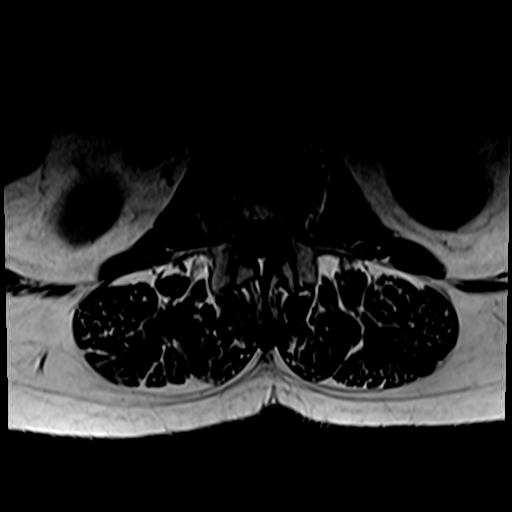
[im 26/37]
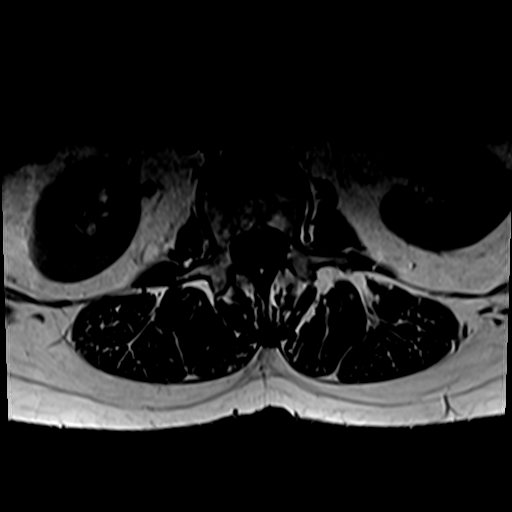
[im 31/37]
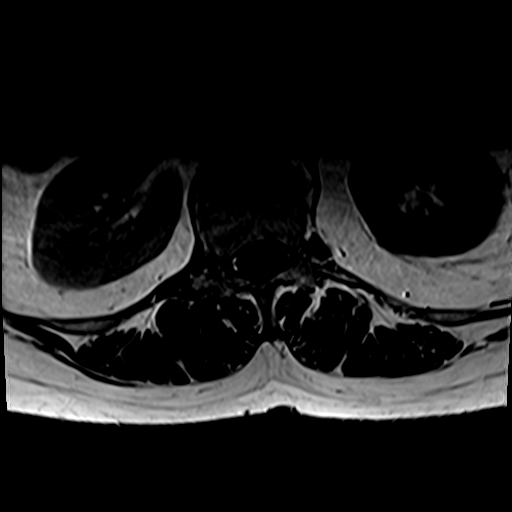
[im 37/37]
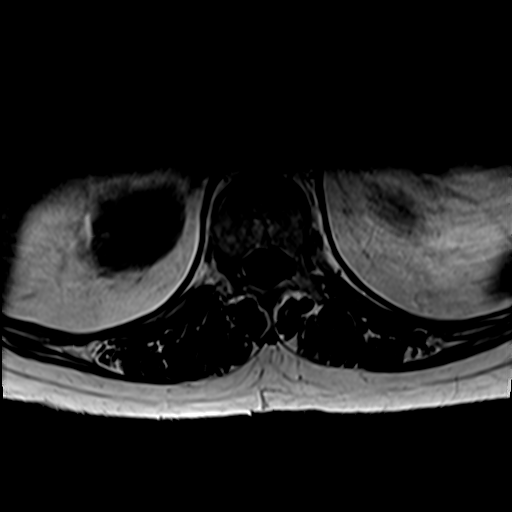

[30 of 48 positions shown; findings below may reference images not displayed]

FINDINGS: Segmentation: Standard; the lowest formed disc space is designated
L5-S1

Alignment: There is 6 mm anterolisthesis of L4 on L5 and 4 mm
anterolisthesis of L5 on S1, not significantly changed since 8118.
Alignment is otherwise normal.

Vertebrae: The patient is status post bilateral L5-S1 posterior
instrumented fusion. Vertebral body heights are preserved. Marrow
signal is normal. There is no abnormal marrow edema. There is a
suspected right pars defect at L4-L5.

Conus medullaris and cauda equina: Conus extends to the L1 level.
Conus and cauda equina appear normal.

Paraspinal and other soft tissues: Unremarkable.

Disc levels:

There is multilevel disc desiccation without significant loss of
height.

T12-L1: There is mild bilateral facet arthropathy without
significant spinal canal or neural foraminal stenosis

L1-L2: There is a mild disc bulge and mild left worse than right
facet arthropathy resulting in mild left worse than right neural
foraminal stenosis and mild spinal canal stenosis.

L2-L3: There is a mild disc bulge and mild bilateral facet
arthropathy without significant spinal canal or neural foraminal
stenosis.

L3-L4: There is a mild disc bulge, ligamentum flavum thickening, and
mild bilateral facet arthropathy resulting in mild spinal canal
stenosis with narrowing of the subarticular zones and mild to
moderate right and mild left neural foraminal stenosis

L4-L5: There is grade 1 anterolisthesis with uncovering of the disc
posteriorly with a small central annular fissure and bulky bilateral
facet arthropathy with effusions resulting in moderate spinal canal
stenosis with narrowing of the subarticular zones and possible
impingement of either traversing L5 nerve root, and severe right and
moderate left neural foraminal stenosis

L5-S1: There is grade 1 anterolisthesis with uncovering of the disc
posteriorly and bilateral facet arthropathy resulting in moderate
spinal canal stenosis just superior to the level of the disc space
with narrowing of the subarticular zones and possible mass effect on
either traversing S1 nerve root, and moderate left and mild right
neural foraminal stenosis.
IMPRESSION: 1. Status post L5-S1 posterior instrumented fusion. Grade 1
anterolisthesis of L5 on S1 is not significantly changed compared to
the prior radiographs from 8118. Degenerative changes at L5-S1
result in moderate spinal canal stenosis just superior to the level
of the disc space with narrowing of the subarticular zones and
possible mass effect on either traversing S1 nerve root, and
moderate left and mild right neural foraminal stenosis.
2. Grade 1 anterolisthesis of L4 on L5 is also not significantly
changed, with a suspected right pars defect. CT could be considered
to confirm. Degenerative changes at this level including bulky
bilateral facet arthropathy with effusions result in moderate spinal
canal stenosis with narrowing of the subarticular zones and possible
mass effect on either traversing L5 nerve root, and severe right and
moderate left neural foraminal stenosis.
3. Mild degenerative changes at the remaining levels without
significant spinal canal or neural foraminal stenosis, and no other
evidence of nerve root impingement.

## 2022-07-03 ENCOUNTER — Ambulatory Visit (INDEPENDENT_AMBULATORY_CARE_PROVIDER_SITE_OTHER): Payer: Medicare PPO

## 2022-07-03 ENCOUNTER — Ambulatory Visit
Admission: EM | Admit: 2022-07-03 | Discharge: 2022-07-03 | Disposition: A | Discharge status: Home / Self Care | Payer: Medicare PPO | Attending: Family Medicine | Admitting: Family Medicine

## 2022-07-03 DIAGNOSIS — M25532 Pain in left wrist: Secondary | ICD-10-CM

## 2022-07-03 DIAGNOSIS — M79642 Pain in left hand: Secondary | ICD-10-CM

## 2022-07-03 MED ORDER — CYCLOBENZAPRINE HCL 10 MG PO TABS: 10.0000 mg | ORAL_TABLET | Freq: Three times a day (TID) | ORAL | 0 refills | Status: AC | PRN

## 2022-07-03 MED ORDER — NAPROXEN 500 MG PO TABS: 500.0000 mg | ORAL_TABLET | Freq: Two times a day (BID) | ORAL | 0 refills | Status: AC

## 2022-07-03 NOTE — Discharge Instructions (Addendum)
If medication was prescribed, stop by the pharmacy to pick up your prescriptions.  For your  pain, Take 1500 mg Tylenol twice a day, take muscle relaxer (Flexeril) at bedtime, take  Naprosyn twice a day,  as needed for pain.  Wear your wrist brace to help with support.  Apply cold/warm compresses intermittently, as needed.  As pain recedes, begin normal activities slowly as tolerated.  Follow up with primary care provider or an orthopedic provider, if symptoms persist.  Watch for worsening symptoms such as an increasing weakness or loss of sensation, increasing pain and/or the loss of bladder or bowel function. Should any of these occur, go to the emergency department immediately.

## 2022-07-03 NOTE — ED Provider Notes (Signed)
[facility]    CSN: [id number] Arrival date & time: 07/03/22  1503      History   Chief Complaint Chief Complaint  Patient presents with   Hand Pain    Left hand pain. X1 week    HPI  HPI Sharon Weeks is a 67 y.o. female.   Eugeniap presents for left hand and wrist pain that started after a fall about a week ago.  States that she was going into her room to get her phone to show someone something and tripped over a rug.  She fell with her arm stretched out and then noticed that she started having swelling and pain.  She does not have any difficulty moving her fingers but is having some pain with moving her thumb.  History of injury of her right hand previously requiring surgery.  She is right-handed.     Past Medical History:  Diagnosis Date   Fibromyalgia    GERD (gastroesophageal reflux disease)    Lupus (HCC)    Lupus (HCC)    PONV (postoperative nausea and vomiting)    Rotator cuff tear, right 2013   Sjogren's syndrome without extraglandular involvement ([place])    Wrist fracture, right     Patient Active Problem List   Diagnosis Date Noted   Colon cancer screening    Polyp of transverse colon    Polyp of ascending colon    Spondylolisthesis, congenital 12/29/2019   Cauliflower ear 12/29/2019   Displacement of lumbar intervertebral disc without myelopathy 12/29/2019   Gouty arthropathy 12/29/2019   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   Spinal stenosis of lumbar region 12/29/2019   Sicca syndrome ([place]) 12/29/2019   Shoulder joint pain 12/29/2019   Disorder of skin of trunk 12/29/2019   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 12/29/2019   Low back pain 10/03/2018   Abnormal stress test 07/03/2018   Screening for osteoporosis 04/18/2018   Sprain of scapholunate ligament 07/08/2015   Localized osteoarthritis of left knee 01/31/2015   Gastroesophageal reflux disease without esophagitis 11/22/2014   Morbid obesity due to  excess calories ([place]) 11/22/2014   Systemic lupus erythematosus ([place]) 10/23/2014   Fibromyalgia 06/27/2014   Insomnia 06/27/2014   Chest pain 12/20/2012   Dyspnea 12/20/2012   Rotator cuff tear 04/25/2012    Past Surgical History:  Procedure Laterality Date   BACK SURGERY  12/2010   L4-L5 decompression and L5-S1 laminectomy and fusion   BREAST BIOPSY Right 07/29/2021   [place] bx, coil marker, path pending   CARDIAC CATHETERIZATION  07/25/2018   Dr. Caryl Comes [facility]   COLONOSCOPY     COLONOSCOPY WITH PROPOFOL N/A 09/15/2021   Procedure: COLONOSCOPY WITH PROPOFOL;  Surgeon: Lin Landsman, MD;  Location: [facility];  Service: Gastroenterology;  Laterality: N/A;   REVERSE SHOULDER ARTHROPLASTY Right 02/20/2021   Procedure: REVERSE SHOULDER ARTHROPLASTY;  Surgeon: Corky Mull, MD;  Location: [facility];  Service: Orthopedics;  Laterality: Right;   WRIST FRACTURE SURGERY Right    2018    OB History   No obstetric history on file.      Home Medications    Prior to Admission medications   Medication Sig Start Date End Date Taking? Authorizing Provider  cetirizine (ZYRTEC) 10 MG tablet Take 5 mg by mouth at bedtime. 01/25/18  Yes [provider]  gabapentin (NEURONTIN) 400 MG capsule Take 400 mg by mouth 3 (three) times daily. 12/10/12  Yes [provider]  hydroxychloroquine (PLAQUENIL) 200  MG tablet Take 200 mg by mouth 2 (two) times daily.   Yes [provider]  naproxen (NAPROSYN) 500 MG tablet Take 1 tablet (500 mg total) by mouth 2 (two) times daily with a meal. 07/03/22  Yes Letrice Pollok, DO  aspirin 81 MG tablet Take 81 mg by mouth daily.    [provider]  cyclobenzaprine (FLEXERIL) 10 MG tablet Take 1 tablet (10 mg total) by mouth 3 (three) times daily as needed for muscle spasms. 07/03/22   Lyndee Hensen, DO  diphenhydrAMINE (BENADRYL) 25 MG tablet Take 25 mg by mouth every 6 (six) hours as needed.    [provider]   HYDROcodone-acetaminophen (NORCO) 5-325 MG tablet Take 1-2 tablets by mouth every 6 (six) hours as needed for moderate pain or severe pain. MAXIMUM TOTAL ACETAMINOPHEN DOSE IS 4000 MG PER DAY Patient not taking: Reported on 09/15/2021 02/20/21   Poggi, Marshall Cork, MD  ibuprofen (ADVIL) 800 MG tablet Take 800 mg by mouth every 8 (eight) hours as needed.    [provider]  ondansetron (ZOFRAN ODT) 4 MG disintegrating tablet Take 1 tablet (4 mg total) by mouth every 6 (six) hours as needed for nausea or vomiting. 02/20/21   Poggi, Marshall Cork, MD  nitroGLYCERIN (NITROSTAT) 0.4 MG SL tablet Place 0.4 mg under the tongue every 5 (five) minutes as needed for chest pain.  08/29/19  [provider]  ranitidine (ZANTAC) 150 MG capsule Take 150 mg by mouth 2 (two) times daily.  08/29/19  [provider]    Family History Family History  Problem Relation Age of Onset   Breast cancer Mother    Heart attack Father    Heart failure Sister     Social History Social History   Tobacco Use   Smoking status: Former    Packs/day: 0.50    Years: 12.00    Total pack years: 6.00    Types: Cigarettes    Quit date: 12/20/1985    Years since quitting: 36.5   Smokeless tobacco: Never  Vaping Use   Vaping Use: Never used  Substance Use Topics   Alcohol use: No   Drug use: No     Allergies   Codeine   Review of Systems Review of Systems: :negative unless otherwise stated in HPI.      Physical Exam Triage Vital Signs ED Triage Vitals  Enc Vitals Group     BP      Pulse      Resp      Temp      Temp src      SpO2      Weight      Height      Head Circumference      Peak Flow      Pain Score      Pain Loc      Pain Edu?      Excl. in [place]?    No data found.  Updated Vital Signs BP 120/78 (BP Location: Right Arm)   Pulse 84   Temp 97.8 F (36.6 C) (Oral)   Resp 18   Ht '5\' 2"'$  (1.575 m)   Wt 73.5 kg   SpO2 97%   BMI 29.63 kg/m   Visual Acuity Right Eye  Distance:   Left Eye Distance:   Bilateral Distance:    Right Eye Near:   Left Eye Near:    Bilateral Near:     Physical Exam GEN: well appearing female  in no acute distress  CVS: well perfused  RESP: speaking in full sentences without pause, no respiratory distress  MSK: Left Hand: Inspection: No obvious deformity b/l. + swelling, erythema, +bruising Palpation: no TTP over 2-5th metacarpals, +tenderness near the [facility] joint   ROM: Full ROM of the digits and wrist compared to right. Fully able to extend and flex fingers. Strength: 5/5 strength in the forearm, wrist and interosseus muscles b/l Neurovascular: NV intact b/l Special tests: Negative finkelstein's, negative Phalen's  Fingers:  No new swelling in PIP, DIP joints b/l. Flexor digitorum profundus and superficialis tendon functions are intact.  PIP joint collateral ligaments are stable     UC Treatments / Results  Labs (all labs ordered are listed, but only abnormal results are displayed) Labs Reviewed - No data to display  EKG   Radiology DG Wrist Complete Left  Result Date: 07/03/2022 CLINICAL DATA:  Left wrist pain EXAM: LEFT WRIST - COMPLETE 3+ VIEW COMPARISON:  None Available. FINDINGS: There is no evidence of acute fracture or dislocation. Tiny old ununited ulnar styloid fracture. Mild osteoarthritis of the first CMC and triscaphe joints. Soft tissues are unremarkable. IMPRESSION: Negative. Electronically Signed   By: Davina Poke D.O.   On: 07/03/2022 17:02   DG Hand Complete Left  Result Date: 07/03/2022 CLINICAL DATA:  Trauma, pain EXAM: LEFT HAND - COMPLETE 3+ VIEW COMPARISON:  None Available. FINDINGS: No recent fracture or dislocation is seen. There are small smooth marginated calcifications adjacent to the interphalangeal joints of middle finger and thumb. Small smooth marginated calcification is noted adjacent to the tip of ulnar styloid. These most likely are residual from previous injury. Degenerative  changes are noted in first carpometacarpal joint, first metacarpophalangeal joint and few of the interphalangeal joints. IMPRESSION: No recent fracture or dislocation is seen in left hand. Few small smooth marginated calcifications are most likely residual from previous injury. Degenerative changes are noted in multiple joints as described in the body of the report. Electronically Signed   By: Elmer Picker M.D.   On: 07/03/2022 17:01    Procedures Procedures (including critical care time)  Medications Ordered in UC Medications - No data to display  Initial Impression / Assessment and Plan / UC Course  I have reviewed the triage vital signs and the nursing notes.  Pertinent labs & imaging results that were available during my care of the patient were reviewed by me and considered in my medical decision making (see chart for details).       Pt is a 66 y.o.  female with 1 week of left hand and wrist pain after a fall.  Obtained wrist and hand films.  Personally reviewed by me were  unremarkable for fracture or dislocation. Radiologist notes few small smoothly marginated calcification from previous injury.  She also has multiple joint degenerative disease.  Patient given wrist brace prior to discharge.  Patient to gradually return to normal activities, as tolerated and continue ordinary activities within the limits permitted by pain. Prescribed Naproxen sodium   and muscle relaxer   for pain relief.  Tylenol PRN. Advised patient to avoid other NSAIDs while taking  Naprosyn. Counseled patient on red flag symptoms and when to seek immediate care.    Patient to follow up with orthopedic provider, if symptoms do not improve with conservative treatment.  Return and ED precautions given.   Discussed MDM, treatment plan and plan for follow-up with patient/parent who agrees with plan.   Final Clinical Impressions(s) /  UC Diagnoses   Final diagnoses:  Left hand pain  Left wrist pain      Discharge Instructions      If medication was prescribed, stop by the pharmacy to pick up your prescriptions.  For your  pain, Take 1500 mg Tylenol twice a day, take muscle relaxer (Flexeril) at bedtime, take  Naprosyn twice a day,  as needed for pain.  Wear your wrist brace to help with support.  Apply cold/warm compresses intermittently, as needed.  As pain recedes, begin normal activities slowly as tolerated.  Follow up with primary care provider or an orthopedic provider, if symptoms persist.  Watch for worsening symptoms such as an increasing weakness or loss of sensation, increasing pain and/or the loss of bladder or bowel function. Should any of these occur, go to the emergency department immediately.        ED Prescriptions     Medication Sig Dispense Auth. Provider   cyclobenzaprine (FLEXERIL) 10 MG tablet Take 1 tablet (10 mg total) by mouth 3 (three) times daily as needed for muscle spasms. 30 tablet Quinnlyn Hearns, DO   naproxen (NAPROSYN) 500 MG tablet Take 1 tablet (500 mg total) by mouth 2 (two) times daily with a meal. 30 tablet Leaf [employer], DO      PDMP not reviewed this encounter.   Lyndee Hensen, DO 07/03/22 1721

## 2022-07-03 NOTE — ED Triage Notes (Signed)
Pt states that she injured her left hand. X1 week

## 2022-07-10 ENCOUNTER — Other Ambulatory Visit: Payer: Self-pay | Admitting: Family Medicine

## 2022-07-10 DIAGNOSIS — Z1231 Encounter for screening mammogram for malignant neoplasm of breast: Secondary | ICD-10-CM

## 2022-10-12 ENCOUNTER — Ambulatory Visit
Admission: RE | Admit: 2022-10-12 | Discharge: 2022-10-12 | Disposition: A | Discharge status: Home / Self Care | Payer: Medicare PPO | Source: Ambulatory Visit | Attending: Family Medicine | Admitting: Family Medicine

## 2022-10-12 DIAGNOSIS — Z1231 Encounter for screening mammogram for malignant neoplasm of breast: Secondary | ICD-10-CM | POA: Insufficient documentation

## 2023-08-30 ENCOUNTER — Other Ambulatory Visit: Payer: Self-pay

## 2023-08-30 ENCOUNTER — Encounter: Payer: Self-pay | Admitting: Emergency Medicine

## 2023-08-30 ENCOUNTER — Emergency Department: Payer: Medicare PPO

## 2023-08-30 DIAGNOSIS — R0789 Other chest pain: Secondary | ICD-10-CM | POA: Insufficient documentation

## 2023-08-30 LAB — CBC
HCT: 38.7 % (ref 36.0–46.0)
Hemoglobin: 12.4 g/dL (ref 12.0–15.0)
MCH: 28.8 pg (ref 26.0–34.0)
MCHC: 32 g/dL (ref 30.0–36.0)
MCV: 89.8 fL (ref 80.0–100.0)
Platelets: 220 10*3/uL (ref 150–400)
RBC: 4.31 MIL/uL (ref 3.87–5.11)
RDW: 12.7 % (ref 11.5–15.5)
WBC: 6 10*3/uL (ref 4.0–10.5)
nRBC: 0 % (ref 0.0–0.2)

## 2023-08-30 LAB — TROPONIN I (HIGH SENSITIVITY)
Troponin I (High Sensitivity): 3 ng/L (ref <18)
Troponin I (High Sensitivity): 3 ng/L (ref <18)

## 2023-08-30 LAB — BASIC METABOLIC PANEL
Anion gap: 7 (ref 5–15)
BUN: 12 mg/dL (ref 8–23)
CO2: 24 mmol/L (ref 22–32)
Calcium: 9.4 mg/dL (ref 8.9–10.3)
Chloride: 109 mmol/L (ref 98–111)
Creatinine, Ser: 0.83 mg/dL (ref 0.44–1.00)
GFR, Estimated: >60 mL/min (ref >60)
Glucose, Bld: 104 mg/dL — ABNORMAL HIGH (ref 70–99)
Potassium: 3.7 mmol/L (ref 3.5–5.1)
Sodium: 140 mmol/L (ref 135–145)

## 2023-08-30 NOTE — ED Triage Notes (Signed)
Patient ambulatory to triage with complaints of chest pain, heavy pressure in her central chest that started approx 1 hour PTA. Denies prior hx of this. Is unsure of medical hx and exact medications she takes

## 2023-08-31 ENCOUNTER — Emergency Department
Admission: EM | Admit: 2023-08-31 | Discharge: 2023-08-31 | Disposition: A | Discharge status: Home / Self Care | Payer: Medicare PPO | Attending: Emergency Medicine | Admitting: Emergency Medicine

## 2023-08-31 DIAGNOSIS — R079 Chest pain, unspecified: Secondary | ICD-10-CM

## 2023-08-31 MED ORDER — ASPIRIN 81 MG PO CHEW
324.0000 mg | CHEWABLE_TABLET | Freq: Once | ORAL | Status: AC
  Administered 2023-08-31: 324 mg via ORAL
  Filled 2023-08-31: qty 4

## 2023-08-31 NOTE — ED Provider Notes (Signed)
 [facility] Provider Note    Event Date/Time   First MD Initiated Contact with Patient 08/31/23 [phone]     (approximate)   History   Chest Pain   HPI  Sharon Weeks is a 67 y.o. female with a history of fibromyalgia and lupus  Patient reports for the last few weeks she has had an intermittent off-and-on sort of discomfort like a pressure sensation in the left chest.  It does not radiate there is no shortness of breath.    The pain has improved.  As she has been in the waiting room about 12 hours.  It has since subsided quite a bit.  She denies any fever cough or difficulty breathing.  No recent surgery, believes her last surgery a couple years ago  She had some slight swelling in both of her ankles 2 weeks ago when she took a dose of prednisone  but that has resolved.  No history of blood clots no swelling in either leg.  No long trips or travel    She relates she had something very similar about the same symptoms around 4 or 5 years ago was seen at [facility] and they ran a wire up into her heart to check things out.  Is not know of any heart problems that she has is not a smoker   Physical Exam   Triage Vital Signs: ED Triage Vitals  Encounter Vitals Group     BP 08/30/23 1945 (!) 148/78     Systolic BP Percentile --      Diastolic BP Percentile --      Pulse Rate 08/30/23 1945 95     Resp 08/30/23 1945 18     Temp 08/30/23 1945 97.7 F (36.5 C)     Temp Source 08/30/23 1945 Oral     SpO2 08/30/23 1945 94 %     Weight 08/30/23 1937 160 lb (72.6 kg)     Height 08/30/23 1937 5' 2 (1.575 m)     Head Circumference --      Peak Flow --      Pain Score 08/30/23 1937 7     Pain Loc --      Pain Education --      Exclude from Growth Chart --     Most recent vital signs: Vitals:   08/31/23 0002 08/31/23 0608  BP: 137/79 132/81  Pulse: 88 88  Resp: 15 16  Temp: 98.2 F (36.8 C) 98 F (36.7 C)  SpO2: 94% 98%     General: Awake, no  distress.  Ambulatory without difficulty CV:  Good peripheral perfusion.  Normal tones and rate Resp:  Normal effort.  Clear bilateral Abd:  No distention.  Other:  No lower extremity edema venous cords or congestion.   ED Results / Procedures / Treatments   Labs (all labs ordered are listed, but only abnormal results are displayed) Labs Reviewed  BASIC METABOLIC PANEL - Abnormal; Notable for the following components:      Result Value   Glucose, Bld 104 (*)    All other components within normal limits  CBC  TROPONIN I (HIGH SENSITIVITY)  TROPONIN I (HIGH SENSITIVITY)     EKG  And interpreted by me at 1942 heart rate 90 QRS 90 QTc 440 Normal sinus rhythm no evidence of acute ischemia   RADIOLOGY Chest x-ray interpreted by me as negative for acute finding or infiltrate    PROCEDURES:  Critical Care performed: No  Procedures  MEDICATIONS ORDERED IN ED: Medications  aspirin  chewable tablet 324 mg (324 mg Oral Given 08/31/23 0811)     IMPRESSION / MDM / ASSESSMENT AND PLAN / ED COURSE  I reviewed the triage vital signs and the nursing notes.                              Differential diagnosis includes, but is not limited to, ACS, aortic dissection, pulmonary embolism, cardiac tamponade, pneumothorax, pneumonia, pericarditis, myocarditis, GI-related causes including esophagitis/gastritis, and musculoskeletal chest wall pain.     Low pretest probability for concerns of causation such as dissection, PE, cardiac tamponade.  Imaging demonstrates no evidence to suggest effusion, pneumothorax.  She has no associated obvious GI symptoms.  Her ECG reassuring and she has troponin x 2 normal.  I was able to find records, previous heart catheterization at [facility] was reassuring  Patient's presentation is most consistent with acute complicated illness / injury requiring diagnostic workup.   No moving pain.  No sharp pain.  No pleuritic elements.  I do not find evidence to  suggest concern for further workup for dissection or PE.  No exam findings or clinical history to suggest high risk for PE.  Patient's symptoms improved have been waxing and waning for a couple weeks.   Clinical Course as of 08/31/23 0813  Tue Aug 31, 2023  [address] [facility] 2019 CONCLUSIONS:  No significant coronary artery disease  Normal left ventricular end-diastolic pressure  Hyperdynamic left ventricular systolic function and ejection fraction estimated at greater than 65%  [MQ]    Clinical Course User Index [MQ] Dicky Anes, MD   Discussed with patient careful return precautions, patient in agreement.  Will follow-up with cardiology, urgent referral placed as well as discussed scheduling with patient who is agreeable  Return precautions and treatment recommendations and follow-up discussed with the patient who is agreeable with the plan.   FINAL CLINICAL IMPRESSION(S) / ED DIAGNOSES   Final diagnoses:  Chest pain, moderate coronary artery risk     Rx / DC Orders   ED Discharge Orders          Ordered    Ambulatory referral to Cardiology       Comments: Er chest pain follow-up   08/31/23 0809             Note:  This document was prepared using Dragon voice recognition software and may include unintentional dictation errors.   Dicky Anes, MD 08/31/23 [phone]

## 2023-08-31 NOTE — Discharge Instructions (Addendum)
 You have been seen in the Emergency Department (ED) today for chest pain.  As we have discussed todays test results are normal, but you may require further testing.  Please follow up with the recommended doctor as instructed above in these documents regarding todays emergent visit and your recent symptoms to discuss further management.  Continue to take your regular medications. If you are not doing so already, please also take a daily baby aspirin (81 mg), at least until you follow up with your doctor.  Return to the Emergency Department (ED) if you experience any further chest pain/pressure/tightness, difficulty breathing, or sudden sweating, or other symptoms that concern you.

## 2023-11-03 ENCOUNTER — Encounter: Payer: Self-pay | Admitting: *Deleted

## 2023-11-10 ENCOUNTER — Ambulatory Visit: Payer: Medicare PPO | Admitting: Internal Medicine

## 2023-12-08 ENCOUNTER — Other Ambulatory Visit: Payer: Self-pay | Admitting: Family Medicine

## 2023-12-08 DIAGNOSIS — Z1231 Encounter for screening mammogram for malignant neoplasm of breast: Secondary | ICD-10-CM

## 2023-12-15 ENCOUNTER — Ambulatory Visit
Admission: RE | Admit: 2023-12-15 | Discharge: 2023-12-15 | Disposition: A | Discharge status: Home / Self Care | Source: Ambulatory Visit | Attending: Family Medicine | Admitting: Family Medicine

## 2023-12-15 DIAGNOSIS — Z1231 Encounter for screening mammogram for malignant neoplasm of breast: Secondary | ICD-10-CM | POA: Diagnosis present

## 2024-05-17 ENCOUNTER — Other Ambulatory Visit: Payer: Self-pay | Admitting: Rheumatology

## 2024-05-17 DIAGNOSIS — M8000XD Age-related osteoporosis with current pathological fracture, unspecified site, subsequent encounter for fracture with routine healing: Secondary | ICD-10-CM

## 2024-06-14 ENCOUNTER — Other Ambulatory Visit

## 2024-06-15 ENCOUNTER — Ambulatory Visit
Admission: RE | Admit: 2024-06-15 | Discharge: 2024-06-15 | Disposition: A | Discharge status: Home / Self Care | Source: Ambulatory Visit | Attending: Rheumatology | Admitting: Rheumatology

## 2024-06-15 DIAGNOSIS — Z780 Asymptomatic menopausal state: Secondary | ICD-10-CM | POA: Diagnosis not present

## 2024-06-15 DIAGNOSIS — M8000XD Age-related osteoporosis with current pathological fracture, unspecified site, subsequent encounter for fracture with routine healing: Secondary | ICD-10-CM | POA: Diagnosis present

## 2024-11-17 ENCOUNTER — Ambulatory Visit: Admit: 2024-11-17 | Admitting: Obstetrics and Gynecology
